# Patient Record
Sex: Male | Born: 1935 | Race: Black or African American | Hispanic: No | Marital: Married | Smoking: Never smoker
Health system: Southern US, Community
[De-identification: ages and names within clinical notes are randomized; demographics above are authoritative.]

## PROBLEM LIST (undated history)

## (undated) DIAGNOSIS — M2540 Effusion, unspecified joint: Secondary | ICD-10-CM

## (undated) DIAGNOSIS — M1990 Unspecified osteoarthritis, unspecified site: Secondary | ICD-10-CM

## (undated) DIAGNOSIS — C61 Malignant neoplasm of prostate: Secondary | ICD-10-CM

## (undated) DIAGNOSIS — M2550 Pain in unspecified joint: Secondary | ICD-10-CM

## (undated) HISTORY — PX: OTHER SURGICAL HISTORY: SHX169

---

## 2013-11-12 ENCOUNTER — Emergency Department (HOSPITAL_COMMUNITY)
Admission: EM | Admit: 2013-11-12 | Discharge: 2013-11-12 | Disposition: A | Discharge status: Home / Self Care | Payer: Medicare HMO | Attending: Emergency Medicine | Admitting: Emergency Medicine

## 2013-11-12 ENCOUNTER — Encounter (HOSPITAL_COMMUNITY): Payer: Self-pay | Admitting: Emergency Medicine

## 2013-11-12 ENCOUNTER — Emergency Department (HOSPITAL_COMMUNITY): Payer: Medicare HMO

## 2013-11-12 DIAGNOSIS — Z791 Long term (current) use of non-steroidal anti-inflammatories (NSAID): Secondary | ICD-10-CM | POA: Insufficient documentation

## 2013-11-12 DIAGNOSIS — R404 Transient alteration of awareness: Secondary | ICD-10-CM

## 2013-11-12 DIAGNOSIS — Z79899 Other long term (current) drug therapy: Secondary | ICD-10-CM | POA: Insufficient documentation

## 2013-11-12 DIAGNOSIS — F29 Unspecified psychosis not due to a substance or known physiological condition: Secondary | ICD-10-CM | POA: Insufficient documentation

## 2013-11-12 DIAGNOSIS — Z880 Allergy status to penicillin: Secondary | ICD-10-CM | POA: Insufficient documentation

## 2013-11-12 DIAGNOSIS — Z8546 Personal history of malignant neoplasm of prostate: Secondary | ICD-10-CM | POA: Insufficient documentation

## 2013-11-12 DIAGNOSIS — I10 Essential (primary) hypertension: Secondary | ICD-10-CM | POA: Insufficient documentation

## 2013-11-12 HISTORY — DX: Malignant neoplasm of prostate: C61

## 2013-11-12 LAB — CBC WITH DIFFERENTIAL/PLATELET
Basophils Absolute: 0 10*3/uL (ref 0.0–0.1)
Basophils Relative: 0 % (ref 0–1)
Eosinophils Absolute: 0 10*3/uL (ref 0.0–0.7)
Eosinophils Relative: 0 % (ref 0–5)
HEMATOCRIT: 43.8 % (ref 39.0–52.0)
Hemoglobin: 15.4 g/dL (ref 13.0–17.0)
LYMPHS ABS: 0.9 10*3/uL (ref 0.7–4.0)
LYMPHS PCT: 7 % — AB (ref 12–46)
MCH: 34.5 pg — ABNORMAL HIGH (ref 26.0–34.0)
MCHC: 35.2 g/dL (ref 30.0–36.0)
MCV: 98.2 fL (ref 78.0–100.0)
MONOS PCT: 5 % (ref 3–12)
Monocytes Absolute: 0.6 10*3/uL (ref 0.1–1.0)
NEUTROS ABS: 10.9 10*3/uL — AB (ref 1.7–7.7)
NEUTROS PCT: 88 % — AB (ref 43–77)
Platelets: 183 10*3/uL (ref 150–400)
RBC: 4.46 MIL/uL (ref 4.22–5.81)
RDW: 13.2 % (ref 11.5–15.5)
WBC: 12.3 10*3/uL — AB (ref 4.0–10.5)

## 2013-11-12 LAB — URINALYSIS, ROUTINE W REFLEX MICROSCOPIC
Bilirubin Urine: NEGATIVE
GLUCOSE, UA: NEGATIVE mg/dL
Ketones, ur: NEGATIVE mg/dL
LEUKOCYTES UA: NEGATIVE
Nitrite: NEGATIVE
Protein, ur: NEGATIVE mg/dL
SPECIFIC GRAVITY, URINE: 1.022 (ref 1.005–1.030)
Urobilinogen, UA: 0.2 mg/dL (ref 0.0–1.0)
pH: 6.5 (ref 5.0–8.0)

## 2013-11-12 LAB — URINE MICROSCOPIC-ADD ON

## 2013-11-12 LAB — GLUCOSE, CAPILLARY: Glucose-Capillary: 104 mg/dL — ABNORMAL HIGH (ref 70–99)

## 2013-11-12 LAB — POCT I-STAT TROPONIN I: Troponin i, poc: 0.01 ng/mL (ref 0.00–0.08)

## 2013-11-12 MED ORDER — ACETAMINOPHEN 325 MG PO TABS
650.0000 mg | ORAL_TABLET | Freq: Once | ORAL | Status: AC
  Administered 2013-11-12: 650 mg via ORAL
  Filled 2013-11-12: qty 2

## 2013-11-12 NOTE — Discharge Instructions (Signed)
Altered Mental Status Altered mental status most often refers to an abnormal change in your responsiveness and awareness. It can affect your speech, thought, mobility, memory, attention span, or alertness. It can range from slight confusion to complete unresponsiveness (coma). Altered mental status can be a sign of a serious underlying medical condition. Rapid evaluation and medical treatment is necessary for patients having an altered mental status. CAUSES   Low blood sugar (hypoglycemia) or diabetes.  Severe loss of body fluids (dehydration) or a body salt (electrolyte) imbalance.  A stroke or other neurologic problem, such as dementia or delirium.  A head injury or tumor.  A drug or alcohol overdose.  Exposure to toxins or poisons.  Depression, anxiety, and stress.  A low oxygen level (hypoxia).  An infection.  Blood loss.  Twitching or shaking (seizure).  Heart problems, such as heart attack or heart rhythm problems (arrhythmias).  A body temperature that is too low or too high (hypothermia or hyperthermia). DIAGNOSIS  A diagnosis is based on your history, symptoms, physical and neurologic examinations, and diagnostic tests. Diagnostic tests may include:  Measurement of your blood pressure, pulse, breathing, and oxygen levels (vital signs).  Blood tests.  Urine tests.  X-ray exams.  A computerized magnetic scan (magnetic resonance imaging, MRI).  A computerized X-ray scan (computed tomography, CT scan). TREATMENT  Treatment will depend on the cause. Treatment may include:  Management of an underlying medical or mental health condition.  Critical care or support in the hospital. [place]   Only take over-the-counter or prescription medicines for pain, discomfort, or fever as directed by your caregiver.  Manage underlying conditions as directed by your caregiver.  Eat a healthy, well-balanced diet to maintain strength.  Join a support group or  prevention program to cope with the condition or trauma that caused the altered mental status. Ask your caregiver to help choose a program that works for you.  Follow up with your caregiver for further examination, therapy, or testing as directed. SEEK MEDICAL CARE IF:   You feel unwell or have chills.  You or your family notice a change in your behavior or your alertness.  You have trouble following your caregiver's treatment plan.  You have questions or concerns. SEEK IMMEDIATE MEDICAL CARE IF:   You have a rapid heartbeat or have chest pain.  You have difficulty breathing.  You have a fever.  You have a headache with a stiff neck.  You cough up blood.  You have blood in your urine or stool.  You have severe agitation or confusion. MAKE SURE YOU:   Understand these instructions.  Will watch your condition.  Will get help right away if you are not doing well or get worse. Document Released: 03/06/2010 Document Revised: 12/09/2011 Document Reviewed: 03/06/2010 [facility] Patient Information 2014 [place].  Delirium Delirium (acute confusional state) is a sudden change in a person's brain function that causes the person to become confused for a short period of time. People with delirium often have trouble knowing where they are. Delirium comes on very fast. It can develop in a few days or just a few hours. Delirium usually occurs because of another mental or physical condition. For example, a person might develop delirium after a surgery. It is especially common in elderly people who are sick or in the hospital. People with dementia (a brain disease like Alzheimer's) or people who are near death may also develop delirium.  CAUSES  Delirium occurs when something affects the  signals that the brain sends out. These signals can be affected by anything that puts stress on the body and brain, causing brain chemicals to be out of balance. Structural health problems such as acute  strokes, bleeding near the brain (intracranial bleeds), and trauma can also cause delirium. Sometimes the exact cause of delirium is not known. Usually, several factors contribute to the development of delirium. Things that may cause a person to develop delirium include:  Surgery, especially when anesthetics are involved.  Chronic medical conditions, such as chronic lung, heart, or kidney disease.  Fever.  Low body temperature (hypothermia).  Infection, such as pneumonia, severe intestinal infection, severe skin infection, or urinary tract infection.  Poor nutrition that leads to very low vitamin or protein levels in the body (malnutrition).  Body fluid loss (dehydration).  Low blood sugar.  High blood sugar, which typically occurs in people with severe diabetes.  Electrolyte abnormalities, such as sodium imbalance or acid-base disorders.  Low oxygen level.  Low blood pressure.  Uncontrolled high blood pressure.  Brain injury (trauma).  Stroke.  Abuse of alcohol or sudden withdrawal of alcohol.  Sudden tobacco withdrawal if the person is a longtime smoker.  Loss of vision or hearing.  Being strapped down (restrained).  Being in a new setting, such as an elderly person being admitted to a hospital.  Taking illegal drugs or quitting use of those drugs.  Taking certain medicines for pain, sleep, allergies, high blood pressure, anxiety, depression, Parkinson's disease, or seizures.  Sundowning syndrome, which is a complication of chronic dementia that can occur in the later part of a person's wake cycle. SYMPTOMS  The main sign of delirium is a sudden change in a person's mental state. This change can come and go. Symptoms may include:  Not being able to pay attention.  Being confused about places, time, and people.  Seeing, hearing, or feeling things that are not real (hallucinations).  Changes in sleep patterns.  Being restless, hyperactive, irritable, and  angry.  Extreme mood swings.  Rambling and senseless talking.  Difficulty speaking or understanding speech.  Memory loss.  Changes in consciousness, such as being sleepy, sluggish, lethargic, and withdrawn.  Focusing on things or ideas that are not important.  Unusual body movements or shaking (tremors). DIAGNOSIS  There are no specific tests for delirium, but a caregiver may do the following:  Perform a mental status assessment.The caregiver will check for confusion and lack of awareness by talking with the person and asking questions.  Talk with the person's friends and family. A friend or family member will often need to tell the caregiver about the person's symptoms and medical history, including medicines taken or missed.  Perform a physical exam. The caregiver will perform a physical exam to check for underlying conditions that may lead to delirium, such as dehydration, malnutrition, trauma, and infection. The exam may include checking for changes in vision, hearing, and the way the person moves (coordination and reflexes). The caregiver may order tests, such as:  Blood tests.  Urine tests.  Brain imaging, such as a CT scan or MRI scan.  X-rays (to look for lung problems or intestinal blockage). TREATMENT  Treatment will focus on the cause of the delirium. Delirium is a sign of another problem. If that problem can be found and treated, the delirium may go away. Full recovery can take several weeks. Keeping the person safe until the cause can be found and treated (supportive care) is often what is needed.  In some cases, medicine may be prescribed to help keep the person calm. People with delirium should not be left alone because they may involuntarily harm themselves. HOME CARE INSTRUCTIONS  Supportive care is provided by the person living with, or caring for, the person with delirium. It involves keeping the person safe, encouraging healthy habits, and helping the person stay  aware of his or her surroundings. Take these steps to help care for someone with delirium:  Make sure the person eats a healthy diet.  Make sure the person gets enough fluids. The person should drink enough fluids to keep urine clear or pale yellow.  Do not leave the person alone.  Keep the person on a regular schedule. Maintain regular times for meals, sleeping, and being active.  Keep the home as quiet and stress-free as possible. This is very important at night and will help improve the quality of the person's sleep.  Let lots of sunlight into the home during the day.  Avoid total darkness at night.  Help the person maintain good hygiene to avoid skin infections, bed sores, and pressure ulcers.  Do activities outside as often as possible.  Take the person to see others whenever you can.  Make sure the person uses hearing aids and eyeglasses if needed.  Give frequent verbal reminders of the current time, location, and situation.  Use memory cues, such as clocks, calendars, and family photos.  Try to keep the person calm. Music or relaxation techniques may be helpful.  Always be on the lookout for symptoms of delirium.  Do not use restraints.  Only give over-the-counter or prescription medicines as directed by the caregiver.  Make sure the person takes all medicines on a regular schedule as directed by the caregiver.  Keep all follow-up appointments. SEEK MEDICAL CARE IF:  Any of the person's symptoms become worse.  New signs of delirium develop.  Caring for the person at home does not seem safe.  The person stops eating, drinking, or communicating.  The person develops vomiting. SEEK IMMEDIATE MEDICAL CARE IF:   Symptoms of delirium do not go away after treatment.  The person develops chest pain or shortness of breath.  The person seems to want to harm someone or harm himself or herself. MAKE SURE YOU:   Understand these instructions.  Will watch the  condition.  Will get help right away if the person is not doing well or gets worse. Document Released: 06/10/2012 Document Reviewed: 06/10/2012 [facility] Patient Information 2014 [place], [place].

## 2013-11-12 NOTE — ED Provider Notes (Signed)
Medical screening examination/treatment/procedure(s) were conducted as a shared visit with non-physician practitioner(s) or resident  and myself.  I personally evaluated the patient during the encounter and agree with the findings and plan unless otherwise indicated.    I have personally reviewed any xrays and/ or EKG's with the provider and I agree with interpretation.   New altered mental status since 9 am today, mild confusion with location and clothing, sxs resolved, pt at baseline per family, no hx of similar. No stroke hx.  CT and blood work/ UA unremarkable.  Rechecked, comfortable, normal neuro.  Plan for MRI head and likely fup outpt pending results or observation for TIA/ stroke eval.  5+ strength in UE and LE with f/e at major joints.  Neck supple, no rashes, no meningismus.  Sensation to palpation intact in UE and LE. CNs 2-12 grossly intact.  EOMFI.  PERRL.   Finger nose and coordination intact bilateral.   Visual fields intact to finger testing.  AO4.  Altered mental transient  Mariea Clonts, MD 11/12/13 1729

## 2013-11-12 NOTE — ED Provider Notes (Addendum)
MRI without acute findings or infarct.  Family denies any change in meds other than a BP pill.  But did have cortisone shot in his knee yesterday.  This could also be cause of sx.   No mind altering meds, however new stress in life due to wife being ill and he is caring for all her needs which could be cause of sx today.  Given strict return precautions and d/ced with f/u with pcp on Monday or tues.  Blanchie Dessert, MD 11/12/13 6948  Blanchie Dessert, MD 11/12/13 [phone]

## 2013-11-12 NOTE — ED Notes (Signed)
Per EMS pt from home, pt LSN last night. Pt woke up this morning and per family "not acting right" pt put on two different shoes, peeing outside. Alert and oriented x4. Pt has steady gait, no arm drift & leg drift. Negative stroke screen.

## 2013-11-12 NOTE — ED Notes (Signed)
Patient transported to MRI 

## 2013-11-12 NOTE — ED Provider Notes (Signed)
CSN: [id number]     Arrival date & time 11/12/13  1239 History   First MD Initiated Contact with Patient 11/12/13 1242     Chief Complaint  Patient presents with  . Altered Mental Status     (Consider location/radiation/quality/duration/timing/severity/associated sxs/prior Treatment) HPI  This is a 80 year old male presents from home via EMS for acute altered mental status.  History is taken from EMS report as well as the patient's niece who works here in the hospital.  According to reports the patient woke up this morning and was confused.  He put 2 different shoes on his feet.  Patient also went outside to urinate.  He is generally very alert and oriented.  He takes care of his wife at home.  His niece states this is unlike him.    Past Medical History  Diagnosis Date  . Cancer of prostate   . Hypertension    History reviewed. No pertinent past surgical history. No family history on file. History  Substance Use Topics  . Smoking status: Never Smoker   . Smokeless tobacco: Not on file  . Alcohol Use: No    Review of Systems  Unable to perform ROS     Allergies  Penicillins  Home Medications   Current Outpatient Rx  Name  Route  Sig  Dispense  Refill  . bethanechol (URECHOLINE) 50 MG tablet   Oral   Take 50 mg by mouth 2 (two) times daily with a meal.         . Cyanocobalamin (VITAMIN B12 PO)   Oral   Take 1 tablet by mouth every other day.         . hydrochlorothiazide (HYDRODIURIL) 12.5 MG tablet   Oral   Take 12.5 mg by mouth daily.         . meloxicam (MOBIC) 15 MG tablet   Oral   Take 15 mg by mouth daily with supper.          . tamsulosin (FLOMAX) 0.4 MG CAPS capsule   Oral   Take 0.4 mg by mouth 2 (two) times daily with a meal.           BP 168/69  Pulse 86  Temp(Src) 99.5 F (37.5 C) (Rectal)  Resp 15  SpO2 100% Physical Exam  Nursing note and vitals reviewed. Constitutional: He appears well-developed and well-nourished. No  distress.  HENT:  Head: Normocephalic and atraumatic.  Eyes: Conjunctivae are normal. No scleral icterus.  Neck: Normal range of motion. Neck supple.  Cardiovascular: Normal rate, regular rhythm and normal heart sounds.   Pulmonary/Chest: Effort normal and breath sounds normal. No respiratory distress.  Abdominal: Soft. Bowel sounds are normal. There is no tenderness.  Musculoskeletal: He exhibits no edema.  Neurological: He is alert.  Alert and oriented to person, place, and time. Speech is clear and goal oriented, follows commands Major Cranial nerves without deficit, no facial droop Normal strength in upper and lower extremities bilaterally including dorsiflexion and plantar flexion, strong and equal grip strength Sensation normal to light and sharp touch Moves extremities without ataxia, coordination intact Normal finger to nose and rapid alternating movements Neg romberg, no pronator drift Normal gait    Skin: Skin is warm and dry. He is not diaphoretic.  Psychiatric: His behavior is normal.    ED Course  Procedures (including critical care time) Labs Review Labs Reviewed  CBC WITH DIFFERENTIAL - Abnormal; Notable for the following:    WBC 12.3 (*)  MCH 34.5 (*)    Neutrophils Relative % 88 (*)    Neutro Abs 10.9 (*)    Lymphocytes Relative 7 (*)    All other components within normal limits  GLUCOSE, CAPILLARY - Abnormal; Notable for the following:    Glucose-Capillary 104 (*)    All other components within normal limits  URINALYSIS, ROUTINE W REFLEX MICROSCOPIC - Abnormal; Notable for the following:    Hgb urine dipstick SMALL (*)    All other components within normal limits  URINE MICROSCOPIC-ADD ON   Imaging Review Dg Chest 2 View  11/12/2013   CLINICAL DATA:  Altered mental status.  EXAM: CHEST  2 VIEW  COMPARISON:  None.  FINDINGS: The cardiac silhouette, mediastinal and hilar contours are normal. The lungs are clear. No pleural effusion. The bony thorax is  intact.  IMPRESSION: No acute cardiopulmonary findings.   Electronically Signed   By: Kalman Jewels M.D.   On: 11/12/2013 13:36   Ct Head Wo Contrast  11/12/2013   CLINICAL DATA:  Altered mental status  EXAM: CT HEAD WITHOUT CONTRAST  TECHNIQUE: Contiguous axial images were obtained from the base of the skull through the vertex without intravenous contrast. Study was obtained within 24 hr of patient's arrival at the emergency department.  COMPARISON:  December 11, 2005  FINDINGS: Moderate diffuse atrophy is stable. There is no mass, hemorrhage, extra-axial fluid collection, or midline shift. There is patchy small vessel disease throughout the centra semiovale bilaterally, stable. No new gray-white compartment lesion is identified. No acute infarct is demonstrable on this study.  Bony calvarium appears intact. Visualized mastoid air cells are clear.  IMPRESSION: Atrophy with supratentorial small vessel disease, stable. No intracranial mass, hemorrhage, or acute appearing infarct.   Electronically Signed   By: Lowella Grip M.D.   On: 11/12/2013 13:18    EKG Interpretation    Date/Time:  Friday November 12 2013 12:55:57 EST Ventricular Rate:  82 PR Interval:  122 QRS Duration: 86 QT Interval:  356 QTC Calculation: 416 R Axis:   80 Text Interpretation:  Sinus rhythm Mild ST elevation, no recipricol changes Confirmed by ZAVITZ  MD, JOSHUA ([id number]) on 11/12/2013 2:49:01 PM            MDM   Final diagnoses:  None    Patient seen and cared visit with Dr. Reather Converse Patient here with what appears to be a delerium. The patient is alert and oriented here.  According to his niece he appears to be at baseline currently.  Patient does have slight ST elevation without reciprocal change and there is no old EKG to compare it with.  CT scan is without acute abnormalityi area at a sign of a UTI on urinalysis.  Patient does have a leukocytosis with a left shift.  Normal chest x-ray.  Troponin is currently  pending.  Patient will be sent for MRI of the brain to rule out stroke.  4:33 PM Troponin negative Patient awaiting MRI head. I have given report To Dr. Maryan Rued. Patient will be discharged home for OP TIA work up if negative.  Margarita Mail, PA-C 11/12/13 [phone]

## 2014-10-11 DIAGNOSIS — M1711 Unilateral primary osteoarthritis, right knee: Secondary | ICD-10-CM | POA: Diagnosis not present

## 2014-10-11 DIAGNOSIS — M1712 Unilateral primary osteoarthritis, left knee: Secondary | ICD-10-CM | POA: Diagnosis not present

## 2014-11-02 DIAGNOSIS — R339 Retention of urine, unspecified: Secondary | ICD-10-CM | POA: Diagnosis not present

## 2014-11-02 DIAGNOSIS — C61 Malignant neoplasm of prostate: Secondary | ICD-10-CM | POA: Diagnosis not present

## 2014-11-03 ENCOUNTER — Other Ambulatory Visit: Payer: Self-pay

## 2014-11-03 ENCOUNTER — Encounter (HOSPITAL_COMMUNITY): Payer: Self-pay

## 2014-11-03 ENCOUNTER — Encounter (HOSPITAL_COMMUNITY)
Admission: RE | Admit: 2014-11-03 | Discharge: 2014-11-03 | Disposition: A | Discharge status: Home / Self Care | Payer: Medicare HMO | Source: Ambulatory Visit | Attending: Orthopedic Surgery | Admitting: Orthopedic Surgery

## 2014-11-03 DIAGNOSIS — Z0183 Encounter for blood typing: Secondary | ICD-10-CM | POA: Insufficient documentation

## 2014-11-03 DIAGNOSIS — M179 Osteoarthritis of knee, unspecified: Secondary | ICD-10-CM | POA: Diagnosis not present

## 2014-11-03 DIAGNOSIS — Z01812 Encounter for preprocedural laboratory examination: Secondary | ICD-10-CM | POA: Diagnosis not present

## 2014-11-03 DIAGNOSIS — Z01810 Encounter for preprocedural cardiovascular examination: Secondary | ICD-10-CM | POA: Insufficient documentation

## 2014-11-03 HISTORY — DX: Effusion, unspecified joint: M25.40

## 2014-11-03 HISTORY — DX: Unspecified osteoarthritis, unspecified site: M19.90

## 2014-11-03 HISTORY — DX: Pain in unspecified joint: M25.50

## 2014-11-03 LAB — CBC
HCT: 44.9 % (ref 39.0–52.0)
HEMOGLOBIN: 14.9 g/dL (ref 13.0–17.0)
MCH: 32.6 pg (ref 26.0–34.0)
MCHC: 33.2 g/dL (ref 30.0–36.0)
MCV: 98.2 fL (ref 78.0–100.0)
Platelets: 155 10*3/uL (ref 150–400)
RBC: 4.57 MIL/uL (ref 4.22–5.81)
RDW: 13.5 % (ref 11.5–15.5)
WBC: 4.5 10*3/uL (ref 4.0–10.5)

## 2014-11-03 LAB — SURGICAL PCR SCREEN
MRSA, PCR: NEGATIVE
Staphylococcus aureus: NEGATIVE

## 2014-11-03 LAB — ABO/RH: ABO/RH(D): O POS

## 2014-11-03 LAB — URINALYSIS, ROUTINE W REFLEX MICROSCOPIC
BILIRUBIN URINE: NEGATIVE
GLUCOSE, UA: NEGATIVE mg/dL
KETONES UR: NEGATIVE mg/dL
LEUKOCYTES UA: NEGATIVE
NITRITE: NEGATIVE
PH: 6 (ref 5.0–8.0)
Protein, ur: NEGATIVE mg/dL
Specific Gravity, Urine: 1.021 (ref 1.005–1.030)
Urobilinogen, UA: 0.2 mg/dL (ref 0.0–1.0)

## 2014-11-03 LAB — URINE MICROSCOPIC-ADD ON

## 2014-11-03 LAB — BASIC METABOLIC PANEL
Anion gap: 5 (ref 5–15)
BUN: 14 mg/dL (ref 6–23)
CO2: 31 mmol/L (ref 19–32)
Calcium: 9.4 mg/dL (ref 8.4–10.5)
Chloride: 105 mmol/L (ref 96–112)
Creatinine, Ser: 1.19 mg/dL (ref 0.50–1.35)
GFR calc Af Amer: 59 mL/min — ABNORMAL LOW (ref >90)
GFR calc non Af Amer: 51 mL/min — ABNORMAL LOW (ref >90)
Glucose, Bld: 81 mg/dL (ref 70–99)
Potassium: 4 mmol/L (ref 3.5–5.1)
Sodium: 141 mmol/L (ref 135–145)

## 2014-11-03 LAB — APTT: APTT: 32 s (ref 24–37)

## 2014-11-03 LAB — PROTIME-INR
INR: 1.07 (ref 0.00–1.49)
Prothrombin Time: 14.1 seconds (ref 11.6–15.2)

## 2014-11-03 LAB — TYPE AND SCREEN
ABO/RH(D): O POS
Antibody Screen: NEGATIVE

## 2014-11-03 NOTE — Progress Notes (Addendum)
Medical Md is with [facility] in [place] doesn't have a cardiologist  Denies ever having a heart cath/stress test/echo  EKG done last 11/12/13

## 2014-11-03 NOTE — Pre-Procedure Instructions (Signed)
Alan Rios  11/03/2014   Your procedure is scheduled on:  Mon, Feb 15 @ 7:30 AM  Report to [facility]  at 5:30 AM.  Call this number if you have problems the morning of surgery: [phone]   Remember:   Do not eat food or drink liquids after midnight.   Take these medicines the morning of surgery with A SIP OF WATER: Flomax(Tamsulosin)              Stop taking your Meloxicam and Ibuprofen. No Goody's,BC's,Aleve,Aspirin,Fish Oil,or any Herbal Medications.   Do not wear jewelry.  Do not wear lotions, powders, or colognes. You may wear deodorant.  Men may shave face and neck.  Do not bring valuables to the hospital.  [facility] is not responsible                  for any belongings or valuables.               Contacts, dentures or bridgework may not be worn into surgery.  Leave suitcase in the car. After surgery it may be brought to your room.  For patients admitted to the hospital, discharge time is determined by your                treatment team.                 Special Instructions:  [place] - Preparing for Surgery  Before surgery, you can play an important role.  Because skin is not sterile, your skin needs to be as free of germs as possible.  You can reduce the number of germs on you skin by washing with CHG (chlorahexidine gluconate) soap before surgery.  CHG is an antiseptic cleaner which kills germs and bonds with the skin to continue killing germs even after washing.  Please DO NOT use if you have an allergy to CHG or antibacterial soaps.  If your skin becomes reddened/irritated stop using the CHG and inform your nurse when you arrive at Short Stay.  Do not shave (including legs and underarms) for at least 48 hours prior to the first CHG shower.  You may shave your face.  Please follow these instructions carefully:   1.  Shower with CHG Soap the night before surgery and the                                morning of Surgery.  2.  If you choose to wash  your hair, wash your hair first as usual with your       normal shampoo.  3.  After you shampoo, rinse your hair and body thoroughly to remove the                      Shampoo.  4.  Use CHG as you would any other liquid soap.  You can apply chg directly       to the skin and wash gently with scrungie or a clean washcloth.  5.  Apply the CHG Soap to your body ONLY FROM THE NECK DOWN.        Do not use on open wounds or open sores.  Avoid contact with your eyes,       ears, mouth and genitals (private parts).  Wash genitals (private parts)       with your normal soap.  6.  Wash  thoroughly, paying special attention to the area where your surgery        will be performed.  7.  Thoroughly rinse your body with warm water from the neck down.  8.  DO NOT shower/wash with your normal soap after using and rinsing off       the CHG Soap.  9.  Pat yourself dry with a clean towel.            10.  Wear clean pajamas.            11.  Place clean sheets on your bed the night of your first shower and do not        sleep with pets.  Day of Surgery  Do not apply any lotions/deoderants the morning of surgery.  Please wear clean clothes to the hospital/surgery center.     Please read over the following fact sheets that you were given: Pain Booklet, Coughing and Deep Breathing, Blood Transfusion Information, MRSA Information and Surgical Site Infection Prevention

## 2014-11-10 NOTE — H&P (Signed)
TOTAL KNEE ADMISSION H&P  Patient is being admitted for left total knee arthroplasty.  Subjective:  Chief Complaint:left knee pain.  HPI: Alan Rios, 81 y.o. male, has a history of pain and functional disability in the left knee due to arthritis and has failed non-surgical conservative treatments for greater than 12 weeks to includeNSAID's and/or analgesics, corticosteriod injections, viscosupplementation injections and use of assistive devices.  Onset of symptoms was gradual, starting >10 years ago with gradually worsening course since that time. The patient noted no past surgery on the left knee(s).  Patient currently rates pain in the left knee(s) at 10 out of 10 with activity. Patient has night pain, worsening of pain with activity and weight bearing, pain that interferes with activities of daily living and crepitus.  Patient has evidence of subchondral sclerosis, periarticular osteophytes and joint space narrowing by imaging studies. There is no active infection.  There are no active problems to display for this patient.  Past Medical History  Diagnosis Date  . Cancer of prostate   . Arthritis   . Joint swelling   . Joint pain     Past Surgical History  Procedure Laterality Date  . Cataract surgery Bilateral     No prescriptions prior to admission   Allergies  Allergen Reactions  . Penicillins Hives    History  Substance Use Topics  . Smoking status: Never Smoker   . Smokeless tobacco: Not on file  . Alcohol Use: No    No family history on file.   Review of Systems  Constitutional: Negative.   HENT: Positive for hearing loss.   Eyes: Negative.   Respiratory: Negative.   Cardiovascular: Negative.   Gastrointestinal: Negative.   Genitourinary: Negative.   Musculoskeletal: Positive for joint pain.  Skin: Negative.   Neurological: Negative.   Endo/Heme/Allergies: Negative.   Psychiatric/Behavioral: Negative.     Objective:  Physical Exam  Constitutional:  He is oriented to person, place, and time. He appears well-developed and well-nourished.  HENT:  Head: Normocephalic and atraumatic.  Eyes: Pupils are equal, round, and reactive to light.  Neck: Normal range of motion. Neck supple.  Cardiovascular: Intact distal pulses.   Respiratory: Effort normal.  Musculoskeletal: He exhibits tenderness.  He has impressive varus deformities of both knees with 1-2+ effusions bilaterally, 10 flexion contractures and can flex  To 120 bilaterally.    Neurological: He is alert and oriented to person, place, and time.  Skin: Skin is warm and dry.  Psychiatric: He has a normal mood and affect. His behavior is normal. Judgment and thought content normal.    Vital signs in last 24 hours:    Labs:   There is no height or weight on file to calculate BMI.   Imaging Review Plain radiographs demonstrate bilateral AP weight-bearing and bilateral Lutricia Feil are taken and reviewed in office today.  Patient does have medial compartment collapse of bilateral knees with near end-stage arthritis bilaterally.  Assessment/Plan:  End stage arthritis, left knee   The patient history, physical examination, clinical judgment of the provider and imaging studies are consistent with end stage degenerative joint disease of the left knee(s) and total knee arthroplasty is deemed medically necessary. The treatment options including medical management, injection therapy arthroscopy and arthroplasty were discussed at length. The risks and benefits of total knee arthroplasty were presented and reviewed. The risks due to aseptic loosening, infection, stiffness, patella tracking problems, thromboembolic complications and other imponderables were discussed. The patient acknowledged the explanation, agreed to  proceed with the plan and consent was signed. Patient is being admitted for inpatient treatment for surgery, pain control, PT, OT, prophylactic antibiotics, VTE prophylaxis,  progressive ambulation and ADL's and discharge planning. The patient is planning to be discharged to skilled nursing facility

## 2014-11-14 ENCOUNTER — Inpatient Hospital Stay (HOSPITAL_COMMUNITY)
Admission: RE | Admit: 2014-11-14 | Discharge: 2014-11-16 | DRG: 470 | Disposition: A | Discharge status: Skilled Nursing Facility | Payer: Commercial Managed Care - HMO | Source: Ambulatory Visit | Attending: Orthopedic Surgery | Admitting: Orthopedic Surgery

## 2014-11-14 ENCOUNTER — Inpatient Hospital Stay (HOSPITAL_COMMUNITY): Payer: Commercial Managed Care - HMO | Admitting: Anesthesiology

## 2014-11-14 ENCOUNTER — Encounter (HOSPITAL_COMMUNITY)
Admission: RE | Disposition: A | Discharge status: Skilled Nursing Facility | Payer: Self-pay | Source: Ambulatory Visit | Attending: Orthopedic Surgery

## 2014-11-14 ENCOUNTER — Encounter (HOSPITAL_COMMUNITY): Payer: Self-pay | Admitting: Anesthesiology

## 2014-11-14 DIAGNOSIS — Z471 Aftercare following joint replacement surgery: Secondary | ICD-10-CM | POA: Diagnosis not present

## 2014-11-14 DIAGNOSIS — N400 Enlarged prostate without lower urinary tract symptoms: Secondary | ICD-10-CM | POA: Diagnosis not present

## 2014-11-14 DIAGNOSIS — M1712 Unilateral primary osteoarthritis, left knee: Secondary | ICD-10-CM | POA: Diagnosis not present

## 2014-11-14 DIAGNOSIS — S83105A Unspecified dislocation of left knee, initial encounter: Secondary | ICD-10-CM | POA: Diagnosis not present

## 2014-11-14 DIAGNOSIS — Z880 Allergy status to penicillin: Secondary | ICD-10-CM

## 2014-11-14 DIAGNOSIS — D62 Acute posthemorrhagic anemia: Secondary | ICD-10-CM | POA: Diagnosis not present

## 2014-11-14 DIAGNOSIS — Z96652 Presence of left artificial knee joint: Secondary | ICD-10-CM | POA: Diagnosis not present

## 2014-11-14 DIAGNOSIS — M6281 Muscle weakness (generalized): Secondary | ICD-10-CM | POA: Diagnosis not present

## 2014-11-14 DIAGNOSIS — I10 Essential (primary) hypertension: Secondary | ICD-10-CM | POA: Diagnosis not present

## 2014-11-14 DIAGNOSIS — Z8546 Personal history of malignant neoplasm of prostate: Secondary | ICD-10-CM | POA: Diagnosis not present

## 2014-11-14 DIAGNOSIS — R2681 Unsteadiness on feet: Secondary | ICD-10-CM | POA: Diagnosis not present

## 2014-11-14 DIAGNOSIS — R1311 Dysphagia, oral phase: Secondary | ICD-10-CM | POA: Diagnosis not present

## 2014-11-14 DIAGNOSIS — R278 Other lack of coordination: Secondary | ICD-10-CM | POA: Diagnosis not present

## 2014-11-14 DIAGNOSIS — M25562 Pain in left knee: Secondary | ICD-10-CM | POA: Diagnosis not present

## 2014-11-14 DIAGNOSIS — M179 Osteoarthritis of knee, unspecified: Secondary | ICD-10-CM | POA: Diagnosis not present

## 2014-11-14 DIAGNOSIS — R41841 Cognitive communication deficit: Secondary | ICD-10-CM | POA: Diagnosis not present

## 2014-11-14 HISTORY — PX: TOTAL KNEE ARTHROPLASTY: SHX125

## 2014-11-14 SURGERY — ARTHROPLASTY, KNEE, TOTAL
Anesthesia: Spinal | Site: Knee | Laterality: Left

## 2014-11-14 MED ORDER — BUPIVACAINE LIPOSOME 1.3 % IJ SUSP
20.0000 mL | Freq: Once | INTRAMUSCULAR | Status: DC
Start: 2014-11-14 — End: 2014-11-14
  Filled 2014-11-14: qty 20

## 2014-11-14 MED ORDER — METOCLOPRAMIDE HCL 10 MG PO TABS: 5.0000 mg | ORAL_TABLET | Freq: Three times a day (TID) | ORAL | Status: DC | PRN

## 2014-11-14 MED ORDER — FENTANYL CITRATE 0.05 MG/ML IJ SOLN
INTRAMUSCULAR | Status: DC | PRN
  Administered 2014-11-14 (×2): 25 ug via INTRAVENOUS

## 2014-11-14 MED ORDER — FLEET ENEMA 7-19 GM/118ML RE ENEM: 1.0000 | ENEMA | Freq: Once | RECTAL | Status: AC | PRN

## 2014-11-14 MED ORDER — MENTHOL 3 MG MT LOZG: 1.0000 | LOZENGE | OROMUCOSAL | Status: DC | PRN

## 2014-11-14 MED ORDER — FENTANYL CITRATE 0.05 MG/ML IJ SOLN
25.0000 ug | INTRAMUSCULAR | Status: DC | PRN
Start: 2014-11-14 — End: 2014-11-14

## 2014-11-14 MED ORDER — TRANEXAMIC ACID 100 MG/ML IV SOLN
2000.0000 mg | Freq: Once | INTRAVENOUS | Status: AC
  Administered 2014-11-14: 2000 mg via TOPICAL
  Filled 2014-11-14: qty 20

## 2014-11-14 MED ORDER — BISACODYL 5 MG PO TBEC: 5.0000 mg | DELAYED_RELEASE_TABLET | Freq: Every day | ORAL | Status: DC | PRN

## 2014-11-14 MED ORDER — HYDROCODONE-ACETAMINOPHEN 5-325 MG PO TABS: 1.0000 | ORAL_TABLET | Freq: Four times a day (QID) | ORAL | Status: AC | PRN

## 2014-11-14 MED ORDER — PROPOFOL INFUSION 10 MG/ML OPTIME
INTRAVENOUS | Status: DC | PRN
  Administered 2014-11-14: 25 ug/kg/min via INTRAVENOUS

## 2014-11-14 MED ORDER — ONDANSETRON HCL 4 MG PO TABS: 4.0000 mg | ORAL_TABLET | Freq: Four times a day (QID) | ORAL | Status: DC | PRN

## 2014-11-14 MED ORDER — BETHANECHOL CHLORIDE 25 MG PO TABS
50.0000 mg | ORAL_TABLET | Freq: Two times a day (BID) | ORAL | Status: DC
  Administered 2014-11-14 – 2014-11-16 (×4): 50 mg via ORAL
  Filled 2014-11-14 (×5): qty 2

## 2014-11-14 MED ORDER — ACETAMINOPHEN 325 MG PO TABS: 325.0000 mg | ORAL_TABLET | ORAL | Status: DC | PRN

## 2014-11-14 MED ORDER — PHENOL 1.4 % MT LIQD: 1.0000 | OROMUCOSAL | Status: DC | PRN

## 2014-11-14 MED ORDER — VANCOMYCIN HCL IN DEXTROSE 1-5 GM/200ML-% IV SOLN
1000.0000 mg | Freq: Once | INTRAVENOUS | Status: AC
  Administered 2014-11-14: 1000 mg via INTRAVENOUS
  Filled 2014-11-14 (×2): qty 200

## 2014-11-14 MED ORDER — CEFUROXIME SODIUM 1.5 G IJ SOLR
INTRAMUSCULAR | Status: AC
  Filled 2014-11-14: qty 1.5

## 2014-11-14 MED ORDER — DOCUSATE SODIUM 100 MG PO CAPS
100.0000 mg | ORAL_CAPSULE | Freq: Two times a day (BID) | ORAL | Status: DC
  Administered 2014-11-14 – 2014-11-16 (×5): 100 mg via ORAL
  Filled 2014-11-14 (×4): qty 1

## 2014-11-14 MED ORDER — ONDANSETRON HCL 4 MG/2ML IJ SOLN: 4.0000 mg | Freq: Four times a day (QID) | INTRAMUSCULAR | Status: DC | PRN

## 2014-11-14 MED ORDER — DEXTROSE 5 % IV SOLN
INTRAVENOUS | Status: DC | PRN
  Administered 2014-11-14: 07:00:00 via INTRAVENOUS

## 2014-11-14 MED ORDER — LACTATED RINGERS IV SOLN
INTRAVENOUS | Status: DC | PRN
  Administered 2014-11-14 (×2): via INTRAVENOUS

## 2014-11-14 MED ORDER — ASPIRIN EC 325 MG PO TBEC: 325.0000 mg | DELAYED_RELEASE_TABLET | Freq: Two times a day (BID) | ORAL | Status: DC

## 2014-11-14 MED ORDER — SENNOSIDES-DOCUSATE SODIUM 8.6-50 MG PO TABS: 1.0000 | ORAL_TABLET | Freq: Every evening | ORAL | Status: DC | PRN

## 2014-11-14 MED ORDER — METOCLOPRAMIDE HCL 5 MG/ML IJ SOLN: 5.0000 mg | Freq: Three times a day (TID) | INTRAMUSCULAR | Status: DC | PRN

## 2014-11-14 MED ORDER — SODIUM CHLORIDE 0.9 % IR SOLN
Status: DC | PRN
  Administered 2014-11-14: 1000 mL

## 2014-11-14 MED ORDER — PROPOFOL 10 MG/ML IV BOLUS
INTRAVENOUS | Status: AC
  Filled 2014-11-14: qty 20

## 2014-11-14 MED ORDER — BUPIVACAINE LIPOSOME 1.3 % IJ SUSP
INTRAMUSCULAR | Status: DC | PRN
  Administered 2014-11-14: 20 mL

## 2014-11-14 MED ORDER — DIPHENHYDRAMINE HCL 12.5 MG/5ML PO ELIX
12.5000 mg | ORAL_SOLUTION | ORAL | Status: DC | PRN
Start: 2014-11-14 — End: 2014-11-16

## 2014-11-14 MED ORDER — HYDROMORPHONE HCL 1 MG/ML IJ SOLN
1.0000 mg | INTRAMUSCULAR | Status: DC | PRN
  Administered 2014-11-14 – 2014-11-15 (×4): 1 mg via INTRAVENOUS
  Filled 2014-11-14 (×4): qty 1

## 2014-11-14 MED ORDER — METHOCARBAMOL 1000 MG/10ML IJ SOLN: 500.0000 mg | Freq: Four times a day (QID) | INTRAVENOUS | Status: DC | PRN

## 2014-11-14 MED ORDER — KCL IN DEXTROSE-NACL 20-5-0.45 MEQ/L-%-% IV SOLN
INTRAVENOUS | Status: DC
  Administered 2014-11-14 – 2014-11-15 (×3): via INTRAVENOUS
  Filled 2014-11-14 (×10): qty 1000

## 2014-11-14 MED ORDER — ASPIRIN EC 325 MG PO TBEC
325.0000 mg | DELAYED_RELEASE_TABLET | Freq: Every day | ORAL | Status: DC
  Administered 2014-11-15 – 2014-11-16 (×2): 325 mg via ORAL
  Filled 2014-11-14 (×2): qty 1

## 2014-11-14 MED ORDER — ALUM & MAG HYDROXIDE-SIMETH 200-200-20 MG/5ML PO SUSP
30.0000 mL | ORAL | Status: DC | PRN
Start: 2014-11-14 — End: 2014-11-16

## 2014-11-14 MED ORDER — ACETAMINOPHEN 650 MG RE SUPP: 650.0000 mg | Freq: Four times a day (QID) | RECTAL | Status: DC | PRN

## 2014-11-14 MED ORDER — TAMSULOSIN HCL 0.4 MG PO CAPS
0.4000 mg | ORAL_CAPSULE | Freq: Two times a day (BID) | ORAL | Status: DC
  Administered 2014-11-14 – 2014-11-16 (×4): 0.4 mg via ORAL
  Filled 2014-11-14 (×4): qty 1

## 2014-11-14 MED ORDER — FENTANYL CITRATE 0.05 MG/ML IJ SOLN
INTRAMUSCULAR | Status: AC
  Filled 2014-11-14: qty 5

## 2014-11-14 MED ORDER — BUPIVACAINE IN DEXTROSE 0.75-8.25 % IT SOLN
INTRATHECAL | Status: DC | PRN
  Administered 2014-11-14: 1.8 mL via INTRATHECAL

## 2014-11-14 MED ORDER — OXYCODONE HCL 5 MG PO TABS
5.0000 mg | ORAL_TABLET | ORAL | Status: DC | PRN
  Administered 2014-11-14 (×3): 10 mg via ORAL
  Filled 2014-11-14 (×4): qty 2

## 2014-11-14 MED ORDER — TIZANIDINE HCL 2 MG PO CAPS: 2.0000 mg | ORAL_CAPSULE | Freq: Three times a day (TID) | ORAL | Status: AC

## 2014-11-14 MED ORDER — METHOCARBAMOL 500 MG PO TABS
500.0000 mg | ORAL_TABLET | Freq: Four times a day (QID) | ORAL | Status: DC | PRN
  Administered 2014-11-14: 500 mg via ORAL
  Filled 2014-11-14: qty 1

## 2014-11-14 MED ORDER — HYDROCHLOROTHIAZIDE 25 MG PO TABS
12.5000 mg | ORAL_TABLET | Freq: Every day | ORAL | Status: DC
  Administered 2014-11-14 – 2014-11-15 (×2): 12.5 mg via ORAL
  Filled 2014-11-14 (×2): qty 1

## 2014-11-14 MED ORDER — SODIUM CHLORIDE 0.9 % IJ SOLN
INTRAMUSCULAR | Status: DC | PRN
  Administered 2014-11-14: 40 mL via INTRAVENOUS

## 2014-11-14 MED ORDER — ACETAMINOPHEN 325 MG PO TABS
650.0000 mg | ORAL_TABLET | Freq: Four times a day (QID) | ORAL | Status: DC | PRN
  Administered 2014-11-15 – 2014-11-16 (×4): 650 mg via ORAL
  Filled 2014-11-14 (×4): qty 2

## 2014-11-14 MED ORDER — ACETAMINOPHEN 160 MG/5ML PO SOLN: 325.0000 mg | ORAL | Status: DC | PRN

## 2014-11-14 SURGICAL SUPPLY — 67 items
BANDAGE ELASTIC 4 VELCRO ST LF (GAUZE/BANDAGES/DRESSINGS) ×2 IMPLANT
BANDAGE ELASTIC 6 VELCRO ST LF (GAUZE/BANDAGES/DRESSINGS) ×2 IMPLANT
BANDAGE ESMARK 6X9 LF (GAUZE/BANDAGES/DRESSINGS) ×1 IMPLANT
BLADE SAG 18X100X1.27 (BLADE) ×3 IMPLANT
BLADE SAW SGTL 13X75X1.27 (BLADE) ×3 IMPLANT
BLADE SURG ROTATE 9660 (MISCELLANEOUS) IMPLANT
BNDG CMPR 9X6 STRL LF SNTH (GAUZE/BANDAGES/DRESSINGS) ×1
BNDG CMPR MED 10X6 ELC LF (GAUZE/BANDAGES/DRESSINGS) ×1
BNDG ELASTIC 6X10 VLCR STRL LF (GAUZE/BANDAGES/DRESSINGS) ×3 IMPLANT
BNDG ESMARK 6X9 LF (GAUZE/BANDAGES/DRESSINGS) ×3
BOWL SMART MIX CTS (DISPOSABLE) ×3 IMPLANT
CAP KNEE TOTAL 3 SIGMA ×2 IMPLANT
CEMENT HV SMART SET (Cement) ×6 IMPLANT
COVER SURGICAL LIGHT HANDLE (MISCELLANEOUS) ×3 IMPLANT
CUFF TOURNIQUET SINGLE 34IN LL (TOURNIQUET CUFF) IMPLANT
CUFF TOURNIQUET SINGLE 44IN (TOURNIQUET CUFF) IMPLANT
DRAPE EXTREMITY T 121X128X90 (DRAPE) ×3 IMPLANT
DRAPE IMP U-DRAPE 54X76 (DRAPES) ×3 IMPLANT
DRAPE U-SHAPE 47X51 STRL (DRAPES) ×3 IMPLANT
DURAPREP 26ML APPLICATOR (WOUND CARE) ×6 IMPLANT
ELECT REM PT RETURN 9FT ADLT (ELECTROSURGICAL) ×3
ELECTRODE REM PT RTRN 9FT ADLT (ELECTROSURGICAL) ×1 IMPLANT
EVACUATOR 1/8 PVC DRAIN (DRAIN) ×3 IMPLANT
GAUZE SPONGE 4X4 12PLY STRL (GAUZE/BANDAGES/DRESSINGS) ×6 IMPLANT
GAUZE XEROFORM 1X8 LF (GAUZE/BANDAGES/DRESSINGS) ×3 IMPLANT
GLOVE BIO SURGEON STRL SZ 6 (GLOVE) ×2 IMPLANT
GLOVE BIO SURGEON STRL SZ7 (GLOVE) ×2 IMPLANT
GLOVE BIO SURGEON STRL SZ7.5 (GLOVE) ×3 IMPLANT
GLOVE BIO SURGEON STRL SZ8.5 (GLOVE) ×5 IMPLANT
GLOVE BIOGEL PI IND STRL 8 (GLOVE) ×1 IMPLANT
GLOVE BIOGEL PI IND STRL 9 (GLOVE) ×1 IMPLANT
GLOVE BIOGEL PI INDICATOR 8 (GLOVE) ×4
GLOVE BIOGEL PI INDICATOR 9 (GLOVE) ×2
GOWN STRL REUS W/ TWL LRG LVL3 (GOWN DISPOSABLE) ×1 IMPLANT
GOWN STRL REUS W/ TWL XL LVL3 (GOWN DISPOSABLE) ×2 IMPLANT
GOWN STRL REUS W/TWL LRG LVL3 (GOWN DISPOSABLE) ×3
GOWN STRL REUS W/TWL XL LVL3 (GOWN DISPOSABLE) ×6
HANDPIECE INTERPULSE COAX TIP (DISPOSABLE) ×3
HOOD PEEL AWAY FACE SHEILD DIS (HOOD) ×6 IMPLANT
KIT BASIN OR (CUSTOM PROCEDURE TRAY) ×3 IMPLANT
KIT ROOM TURNOVER OR (KITS) ×3 IMPLANT
MANIFOLD NEPTUNE II (INSTRUMENTS) ×3 IMPLANT
NDL SAFETY ECLIPSE 18X1.5 (NEEDLE) IMPLANT
NDL SPNL 18GX3.5 QUINCKE PK (NEEDLE) IMPLANT
NEEDLE 22X1 1/2 (OR ONLY) (NEEDLE) ×3 IMPLANT
NEEDLE HYPO 18GX1.5 SHARP (NEEDLE)
NEEDLE SPNL 18GX3.5 QUINCKE PK (NEEDLE) IMPLANT
NS IRRIG 1000ML POUR BTL (IV SOLUTION) ×3 IMPLANT
PACK TOTAL JOINT (CUSTOM PROCEDURE TRAY) ×3 IMPLANT
PACK UNIVERSAL I (CUSTOM PROCEDURE TRAY) ×3 IMPLANT
PAD ARMBOARD 7.5X6 YLW CONV (MISCELLANEOUS) ×6 IMPLANT
PADDING CAST COTTON 6X4 STRL (CAST SUPPLIES) ×3 IMPLANT
SET HNDPC FAN SPRY TIP SCT (DISPOSABLE) ×1 IMPLANT
SPONGE GAUZE 4X4 12PLY STER LF (GAUZE/BANDAGES/DRESSINGS) ×2 IMPLANT
STAPLER VISISTAT 35W (STAPLE) ×3 IMPLANT
SUCTION FRAZIER TIP 10 FR DISP (SUCTIONS) ×3 IMPLANT
SUT VIC AB 0 CT1 27 (SUTURE) ×3
SUT VIC AB 0 CT1 27XBRD ANBCTR (SUTURE) ×1 IMPLANT
SUT VIC AB 1 CTX 36 (SUTURE) ×3
SUT VIC AB 1 CTX36XBRD ANBCTR (SUTURE) ×1 IMPLANT
SUT VIC AB 2-0 CT1 27 (SUTURE) ×3
SUT VIC AB 2-0 CT1 TAPERPNT 27 (SUTURE) ×1 IMPLANT
SYR 30ML LL (SYRINGE) ×3 IMPLANT
SYR 50ML LL SCALE MARK (SYRINGE) ×3 IMPLANT
TOWEL OR 17X24 6PK STRL BLUE (TOWEL DISPOSABLE) ×3 IMPLANT
TOWEL OR 17X26 10 PK STRL BLUE (TOWEL DISPOSABLE) ×3 IMPLANT
WATER STERILE IRR 1000ML POUR (IV SOLUTION) ×6 IMPLANT

## 2014-11-14 NOTE — Interval H&P Note (Signed)
History and Physical Interval Note:  11/14/2014 7:08 AM  Alan Rios  has presented today for surgery, with the diagnosis of OSTEOARTHRITIS LEFT KNEE  The various methods of treatment have been discussed with the patient and family. After consideration of risks, benefits and other options for treatment, the patient has consented to  Procedure(s): TOTAL KNEE ARTHROPLASTY (Left) as a surgical intervention .  The patient's history has been reviewed, patient examined, no change in status, stable for surgery.  I have reviewed the patient's chart and labs.  Questions were answered to the patient's satisfaction.     Kerin Salen

## 2014-11-14 NOTE — Anesthesia Postprocedure Evaluation (Signed)
  Anesthesia Post-op Note  Patient: Alan Rios  Procedure(s) Performed: Procedure(s): TOTAL KNEE ARTHROPLASTY (Left)  Patient Location: PACU  Anesthesia Type:Spinal  Level of Consciousness: awake and alert   Airway and Oxygen Therapy: Patient Spontanous Breathing  Post-op Pain: none  Post-op Assessment: Post-op Vital signs reviewed, Patient's Cardiovascular Status Stable, Respiratory Function Stable, Patent Airway, No signs of Nausea or vomiting and Pain level controlled  Post-op Vital Signs: Reviewed and stable  Last Vitals:  Filed Vitals:   11/14/14 1015  BP:   Pulse: 72  Temp: 36.7 C  Resp: 14    Complications: No apparent anesthesia complications

## 2014-11-14 NOTE — Anesthesia Preprocedure Evaluation (Addendum)
Anesthesia Evaluation  Patient identified by MRN, date of birth, ID band Patient awake    Reviewed: Allergy & Precautions, NPO status , Patient's Chart, lab work & pertinent test results  History of Anesthesia Complications Negative for: history of anesthetic complications  Airway Mallampati: II  TM Distance: >3 FB Neck ROM: Full    Dental  (+) Edentulous Upper, Edentulous Lower   Pulmonary neg pulmonary ROS,  breath sounds clear to auscultation        Cardiovascular hypertension, Pt. on medications - angina- Past MI and - CHF Rhythm:Regular     Neuro/Psych negative neurological ROS  negative psych ROS   GI/Hepatic negative GI ROS, Neg liver ROS,   Endo/Other  negative endocrine ROS  Renal/GU negative Renal ROS     Musculoskeletal  (+) Arthritis -,   Abdominal   Peds  Hematology negative hematology ROS (+)   Anesthesia Other Findings   Reproductive/Obstetrics                           Anesthesia Physical Anesthesia Plan  ASA: II  Anesthesia Plan: Spinal   Post-op Pain Management:    Induction: Intravenous  Airway Management Planned:   Additional Equipment: None  Intra-op Plan:   Post-operative Plan:   Informed Consent: I have reviewed the patients History and Physical, chart, labs and discussed the procedure including the risks, benefits and alternatives for the proposed anesthesia with the patient or authorized representative who has indicated his/her understanding and acceptance.     Plan Discussed with: CRNA and Surgeon  Anesthesia Plan Comments:         Anesthesia Quick Evaluation

## 2014-11-14 NOTE — Evaluation (Signed)
Physical Therapy Evaluation Patient Details Name: Alan Rios MRN: [id number] DOB: August 01, 1919 Today's Date: 11/14/2014   History of Present Illness  Pt admitted s/p L TKA  Clinical Impression  Pt is s/p TKA resulting in the deficits listed below (see PT Problem List).  Pt will benefit from skilled PT to increase their independence and safety with mobility to allow discharge to the venue listed below.     Follow Up Recommendations Home health PT;Supervision/Assistance - 24 hour    Equipment Recommendations  None recommended by PT    Recommendations for Other Services       Precautions / Restrictions Precautions Precautions: Knee Restrictions Weight Bearing Restrictions: Yes LLE Weight Bearing: Weight bearing as tolerated      Mobility  Bed Mobility Overal bed mobility: Needs Assistance Bed Mobility: Supine to Sit;Sit to Supine     Supine to sit: Min assist;HOB elevated Sit to supine: Min assist;HOB elevated   General bed mobility comments: overall mobilizing well; min A needed for LLE only  Transfers Overall transfer level: Needs assistance Equipment used: Rolling walker (2 wheeled) Transfers: Sit to/from Stand Sit to Stand: Min assist;From elevated surface         General transfer comment: cues for safe hand placement and technique  Ambulation/Gait Ambulation/Gait assistance: Min assist Ambulation Distance (Feet): 10 Feet Assistive device: Rolling walker (2 wheeled) Gait Pattern/deviations: Step-to pattern   Gait velocity interpretation: Below normal speed for age/gender General Gait Details: min cues for technique; pt became nauseous after ~7' amb therefore returned to bed  Stairs            Wheelchair Mobility    Modified Rankin (Stroke Patients Only)       Balance Overall balance assessment: Needs assistance Sitting-balance support: Bilateral upper extremity supported;Feet supported Sitting balance-Leahy Scale: Fair     Standing  balance support: During functional activity;Bilateral upper extremity supported Standing balance-Leahy Scale: Fair                               Pertinent Vitals/Pain Pain Assessment: 0-10 Pain Score: 8  Pain Location: L knee Pain Intervention(s): Monitored during session;Repositioned    Home Living Family/patient expects to be discharged to:: Private residence Living Arrangements: Spouse/significant other Available Help at Discharge: Family;Available 24 hours/day (plans to d/c to daughters house) Type of Home: House Home Access: Stairs to enter Entrance Stairs-Rails: Psychiatric nurse of Steps: 4 Home Layout: One level Home Equipment: Environmental consultant - 2 wheels      Prior Function Level of Independence: Independent with assistive device(s)               Hand Dominance        Extremity/Trunk Assessment               Lower Extremity Assessment: Generalized weakness         Communication   Communication: No difficulties  Cognition Arousal/Alertness: Awake/alert Behavior During Therapy: WFL for tasks assessed/performed                        General Comments      Exercises Total Joint Exercises Ankle Circles/Pumps: Both;10 reps;Supine Quad Sets: Left;Supine;10 reps Heel Slides: Left;5 reps;AAROM;Supine Straight Leg Raises: Left;AAROM;5 reps;Supine      Assessment/Plan    PT Assessment Patient needs continued PT services  PT Diagnosis Difficulty walking;Abnormality of gait;Generalized weakness;Acute pain   PT Problem List Decreased strength;Decreased  activity tolerance;Decreased range of motion;Decreased mobility;Decreased balance;Decreased knowledge of use of DME;Decreased safety awareness;Decreased knowledge of precautions;Pain  PT Treatment Interventions DME instruction;Gait training;Stair training;Functional mobility training;Therapeutic activities;Therapeutic exercise;Balance training;Neuromuscular  re-education;Patient/family education   PT Goals (Current goals can be found in the Care Plan section) Acute Rehab PT Goals Patient Stated Goal: to go to daughters home and avoid SNF if possible PT Goal Formulation: With patient/family Time For Goal Achievement: 11/21/14 Potential to Achieve Goals: Good    Frequency 7X/week   Barriers to discharge        Co-evaluation               End of Session   Activity Tolerance: Patient tolerated treatment well;Treatment limited secondary to medical complications (Comment) (nausea) Patient left: in bed;with call bell/phone within reach;with family/visitor present Nurse Communication: Mobility status;Patient requests pain meds         Time: 2951-8841 PT Time Calculation (min) (ACUTE ONLY): 37 min   Charges:   PT Evaluation $Initial PT Evaluation Tier I: 1 Procedure PT Treatments $Gait Training: 8-22 mins $Therapeutic Exercise: 8-22 mins   PT G Codes:       Laureen Abrahams, PT, DPT 11/14/2014 2:10 PM [phone]

## 2014-11-14 NOTE — Op Note (Signed)
PATIENT ID:      Alan Rios  MRN:     [id number] DOB/AGE:    1918-11-25 / 81 y.o.       OPERATIVE REPORT    DATE OF PROCEDURE:  11/14/2014       PREOPERATIVE DIAGNOSIS:   OSTEOARTHRITIS LEFT KNEE      There is no weight on file to calculate BMI.                                                        POSTOPERATIVE DIAGNOSIS:   OSTEOARTHRITIS LEFT KNEE                                                                      PROCEDURE:  Procedure(s): TOTAL KNEE ARTHROPLASTY Using Depuy Sigma RP implants #4L Femur, #4Tibia, 24mm Sigma RP bearing, 38 Patella     SURGEON: Jalin Erpelding J    ASSISTANT:   Eric K[employer]   (Present and scrubbed throughout the case, critical for assistance with exposure, retraction, instrumentation, and closure.)         ANESTHESIA: GET, ACB, Exparel  DRAINS: 2 medium hemovac in knee   TOURNIQUET TIME: 40JWJ   COMPLICATIONS:  None     SPECIMENS: None   INDICATIONS FOR PROCEDURE: The patient has  OSTEOARTHRITIS LEFT KNEE, varus deformities, XR shows bone on bone arthritis. Patient has failed all conservative measures including anti-inflammatory medicines, narcotics, attempts at  exercise and weight loss, cortisone injections and viscosupplementation.  Risks and benefits of surgery have been discussed, questions answered.   DESCRIPTION OF PROCEDURE: The patient identified by armband, received  IV antibiotics, in the holding area at [facility]. Patient taken to the operating room, appropriate anesthetic  monitors were attached, and general endotracheal anesthesia induced with  the patient in supine position, Foley catheter was inserted. Tourniquet  applied high to the operative thigh. Lateral post and foot positioner  applied to the table, the lower extremity was then prepped and draped  in usual sterile fashion from the ankle to the tourniquet. Time-out procedure was performed. The limb was wrapped with an Esmarch bandage and the tourniquet  inflated to 350 mmHg. We began the operation by making the anterior midline incision starting at handbreadth above the patella going over the patella 1 cm medial to and  4 cm distal to the tibial tubercle. Small bleeders in the skin and the  subcutaneous tissue identified and cauterized. Transverse retinaculum was incised and reflected medially and a medial parapatellar arthrotomy was accomplished. the patella was everted and theprepatellar fat pad resected. The superficial medial collateral  ligament was then elevated from anterior to posterior along the proximal  flare of the tibia and anterior half of the menisci resected. The knee was hyperflexed exposing bone on bone arthritis. Peripheral and notch osteophytes as well as the cruciate ligaments were then resected. We continued to  work our way around posteriorly along the proximal tibia, and externally  rotated the tibia subluxing it out from underneath the femur. A McHale  retractor was placed through the  notch and a lateral Hohmann retractor  placed, and we then drilled through the proximal tibia in line with the  axis of the tibia followed by an intramedullary guide rod and 2-degree  posterior slope cutting guide. The tibial cutting guide was pinned into place  allowing resection of 0 mm of bone medially and about 11 mm of bone  laterally because of her varus deformity. Satisfied with the tibial resection, we then  entered the distal femur 2 mm anterior to the PCL origin with the  intramedullary guide rod and applied the distal femoral cutting guide  set at 63mm, with 5 degrees of valgus. This was pinned along the  epicondylar axis. At this point, the distal femoral cut was accomplished without difficulty. We then sized for a #4L femoral component and pinned the guide in 3 degrees of external rotation.The chamfer cutting guide was pinned into place. The anterior, posterior, and chamfer cuts were accomplished without difficulty followed by   the box cutting guide and the box cut. We also removed posterior osteophytes from the posterior femoral condyles. At this  time, the knee was brought into full extension. We checked our  extension and flexion gaps and found them symmetric at 56mm.  The patella thickness measured at 35 mm. We set the cutting guide at 15 and removed the posterior 10 mm  of the patella, sized for a 38 button and drilled the lollipop. The knee  was then once again hyperflexed exposing the proximal tibia. We sized for a #4 tibial base plate, applied the smokestack and the conical reamer followed by the the Delta fin keel punch. We then hammered into place the Sigma RP trial femoral component, inserted a 10-mm trial bearing, trial patellar button, and took the knee through range of motion from 0-130 degrees. No thumb pressure was required for patellar  tracking. At this point, all trial components were removed, a double batch of DePuy HV cement with 1500 mg of Zinacef was mixed and applied to all bony metallic mating surfaces except for the posterior condyles of the femur itself. In order, we  hammered into place the tibial tray and removed excess cement, the femoral component and removed excess cement, a 10-mm Sigma RP bearing  was inserted, and the knee brought to full extension with compression.  The patellar button was clamped into place, and excess cement  removed. While the cement cured the wound was irrigated out with normal saline solution pulse lavage, and medium Hemovac drains were placed from an anterolateral  approach. Ligament stability and patellar tracking were checked and found to be excellent. The parapatellar arthrotomy was closed with  running #1 Vicryl suture. The subcutaneous tissue with 0 and 2-0 undyed  Vicryl suture, and the skin with skin staples. A dressing of Xeroform,  4 x 4, dressing sponges, Webril, and Ace wrap applied. The patient  awakened, extubated, and taken to recovery room without  difficulty.   Alan Rios J 11/14/2014, 8:51 AM

## 2014-11-14 NOTE — Transfer of Care (Signed)
Immediate Anesthesia Transfer of Care Note  Patient: Alan Rios  Procedure(s) Performed: Procedure(s): TOTAL KNEE ARTHROPLASTY (Left)  Patient Location: PACU  Anesthesia Type:Spinal  Level of Consciousness: awake, alert , oriented and patient cooperative  Airway & Oxygen Therapy: Patient Spontanous Breathing and Patient connected to nasal cannula oxygen  Post-op Assessment: Report given to RN, Post -op Vital signs reviewed and stable and Patient moving all extremities  Post vital signs: Reviewed and stable  Last Vitals:  Filed Vitals:   11/14/14 0922  BP:   Pulse:   Temp: 36.6 C  Resp:     Complications: No apparent anesthesia complications

## 2014-11-14 NOTE — Progress Notes (Signed)
Utilization review completed.  

## 2014-11-14 NOTE — Anesthesia Procedure Notes (Signed)
Spinal Patient location during procedure: OR Staffing Anesthesiologist: Brehanna Deveny, CHRIS Preanesthetic Checklist Completed: patient identified, surgical consent, pre-op evaluation, timeout performed, IV checked, risks and benefits discussed and monitors and equipment checked Spinal Block Patient position: sitting Prep: site prepped and draped and DuraPrep Patient monitoring: heart rate, cardiac monitor, continuous pulse ox and blood pressure Approach: midline Location: L4-5 Injection technique: single-shot Needle Needle type: Pencan  Needle gauge: 24 G Needle length: 10 cm Assessment Sensory level: T8

## 2014-11-14 NOTE — Progress Notes (Signed)
Orthopedic Tech Progress Note Patient Details:  Alan Rios Alan Rios 01/09/1919 [id number]  CPM Left Knee CPM Left Knee: On Left Knee Flexion (Degrees): 60 Left Knee Extension (Degrees): 0 Additional Comments: trapeze bar patient helper Viewed order from doctor's order list  Alan Rios 11/14/2014, 10:14 AM

## 2014-11-15 ENCOUNTER — Encounter (HOSPITAL_COMMUNITY): Payer: Self-pay | Admitting: Orthopedic Surgery

## 2014-11-15 LAB — CBC
HCT: 42 % (ref 39.0–52.0)
Hemoglobin: 14 g/dL (ref 13.0–17.0)
MCH: 33.3 pg (ref 26.0–34.0)
MCHC: 33.3 g/dL (ref 30.0–36.0)
MCV: 99.8 fL (ref 78.0–100.0)
PLATELETS: 134 10*3/uL — AB (ref 150–400)
RBC: 4.21 MIL/uL — AB (ref 4.22–5.81)
RDW: 13.5 % (ref 11.5–15.5)
WBC: 8.4 10*3/uL (ref 4.0–10.5)

## 2014-11-15 NOTE — Clinical Social Work Note (Signed)
CSW attempted to meet with patient and patient's family at bedside regarding discharge disposition. Patient currently working with PT. RN reported patient to be discharged to [facility] once medically stable for discharge. CSW to continue to pursue [facility] SNF placement for patient at time of discharge.   CSW to follow-up with full assessment.  Lubertha Sayres, [place] [phone]) Licensed Clinical Social Worker Orthopedics [phone]) and Surgical [phone])

## 2014-11-15 NOTE — Progress Notes (Signed)
Orthopedic Tech Progress Note Patient Details:  Alan Rios 1919/01/30 [id number]  Patient ID: Bard Herbert, male   DOB: Aug 22, 1919, 82 y.o.   MRN: [id number] Placed pt's lle in cpm @0 -60 degrees @1440   Hildred Priest 11/15/2014, 2:37 PM

## 2014-11-15 NOTE — Progress Notes (Signed)
Patient ID: Alan Rios, male   DOB: 01-28-1919, 81 y.o.   MRN: [id number] PATIENT ID: Alan Rios  MRN: [id number]  DOB/AGE:  07/04/1919 / 81 y.o.  1 Day Post-Op Procedure(s) (LRB): TOTAL KNEE ARTHROPLASTY (Left)    PROGRESS NOTE Subjective: Patient is alert, oriented, x1 Nausea, no Vomiting, yes passing gas, no Bowel Movement. Taking PO well. Denies SOB, Chest or Calf Pain. Using Incentive Spirometer, PAS in place. Ambulate WBAT today, CPM 0-40 Patient reports pain as 5 on 0-10 scale  .    Objective: Vital signs in last 24 hours: Filed Vitals:   11/15/14 0000 11/15/14 0139 11/15/14 0400 11/15/14 0558  BP:  168/70  143/69  Pulse:  108  111  Temp:  99.9 F (37.7 C)  99.4 F (37.4 C)  TempSrc:      Resp: 16 16 16 16   Height:      Weight:      SpO2: 97% 97% 98% 98%      Intake/Output from previous day: I/O last 3 completed shifts: In: 1400 [P.O.:100; I.V.:1300] Out: 250 [Urine:100; Drains:150]   Intake/Output this shift:     LABORATORY DATA:  Recent Labs  11/15/14 0626  WBC 8.4  HGB 14.0  HCT 42.0  PLT 134*    Examination: Neurologically intact ABD soft Neurovascular intact Sensation intact distally Intact pulses distally Dorsiflexion/Plantar flexion intact Incision: dressing C/D/I No cellulitis present Compartment soft} Blood and plasma separated in drain indicating minimal recent drainage, drain pulled without difficulty.  Assessment:   1 Day Post-Op Procedure(s) (LRB): TOTAL KNEE ARTHROPLASTY (Left) ADDITIONAL DIAGNOSIS: Expected Acute Blood Loss Anemia,   Plan: PT/OT WBAT, CPM 5/hrs day until ROM 0-90 degrees, then D/C CPM DVT Prophylaxis:  SCDx72hrs, ASA 325 mg BID x 2 weeks DISCHARGE PLAN: Skilled Nursing Facility/Rehab, [address] DISCHARGE NEEDS: HHPT, CPM, Walker and 3-in-1 comode seat     Tashia Leiterman J 11/15/2014, 7:34 AM

## 2014-11-15 NOTE — Clinical Social Work Placement (Signed)
Clinical Social Work Department CLINICAL SOCIAL WORK PLACEMENT NOTE 11/15/2014  Patient:  Alan Rios, Alan Rios  Account Number:  [id number] Admit date:  11/14/2014  Clinical Social Worker:  Durward Fortes, CLINICAL SOCIAL WORKER  Date/time:  11/15/2014 01:06 PM  Clinical Social Work is seeking post-discharge placement for this patient at the following level of care:   SKILLED NURSING   (*CSW will update this form in Epic as items are completed)   11/15/2014  Patient/family provided with [facility] Department of Clinical Social Work's list of facilities offering this level of care within the geographic area requested by the patient (or if unable, by the patient's family).  11/15/2014  Patient/family informed of their freedom to choose among providers that offer the needed level of care, that participate in Medicare, Medicaid or managed care program needed by the patient, have an available bed and are willing to accept the patient.  11/15/2014  Patient/family informed of [facility]' ownership interest in [facility], as well as of the fact that they are under no obligation to receive care at this facility.  PASARR submitted to EDS on 11/15/2014 PASARR number received on 11/15/2014  FL2 transmitted to all facilities in geographic area requested by pt/family on  11/15/2014 FL2 transmitted to all facilities within larger geographic area on   Patient informed that his/her managed care company has contracts with or will negotiate with  certain facilities, including the following:     Patient/family informed of bed offers received:  11/15/2014 Patient chooses bed at [facility] Physician recommends and patient chooses bed at    Patient to be transferred to [facility] on  11/16/2014 Patient to be transferred to facility by [facility] Patient and family notified of transfer on 11/16/2014 Name of family member notified:  Patient's daughter, Geraldo Pitter, and patient's granddaughter,  [place].  The following physician request were entered in Epic:   Additional Comments:   Kierra S. [address], BSW-Intern  Lubertha Sayres, [place] ([phone]) Licensed Clinical Social Worker Orthopedics [phone]) and Surgical [phone])

## 2014-11-15 NOTE — Progress Notes (Signed)
Physical Therapy Treatment Patient Details Name: Alan Rios MRN: [id number] DOB: 02-17-1919 Today's Date: 11/15/2014    History of Present Illness Pt admitted s/p L TKA    PT Comments    Pt very lethargic during session, unable to follow simple one step commands and was unable to state answers to orientation questions.  Requires max A currently for all activity.  Did not attempt gait for safety issues.  CSW made aware of new plan for SNF, as currently family would be unable to manage this level of assist.     Follow Up Recommendations  SNF;Supervision/Assistance - 24 hour     Equipment Recommendations  None recommended by PT    Recommendations for Other Services       Precautions / Restrictions Precautions Precautions: Knee Precaution Comments: Reviewed knee precautions with family and no pillow under knee Restrictions Weight Bearing Restrictions: No LLE Weight Bearing: Weight bearing as tolerated    Mobility  Bed Mobility Overal bed mobility: Needs Assistance Bed Mobility: Supine to Sit Rolling: Max assist Sidelying to sit: Max assist;HOB elevated Supine to sit: Max assist Sit to supine: Max assist;HOB elevated   General bed mobility comments: Pt continues to require tactile and verbal cues to initiate any task, however required physical assist for getting LEs out of bed, as well as to elevate trunk.    Transfers Overall transfer level: Needs assistance Equipment used: Rolling walker (2 wheeled) Transfers: Sit to/from [facility] Sit to Stand: Max assist Stand pivot transfers: Max assist       General transfer comment: Pt unable to stand with use of RW therefore assisted with standing while under R axilla for increased support at max A while nurse tech assisted with peri care and donned new chuck pad for bed due to urine incontinence.  Pt then transferred to bedside commode via stand/squat pivot transfer (again with RW) at max A level.     Ambulation/Gait Ambulation/Gait assistance:  (did not occur due to safety)               Stairs            Wheelchair Mobility    Modified Rankin (Stroke Patients Only)       Balance Overall balance assessment: Needs assistance Sitting-balance support: Feet supported Sitting balance-Leahy Scale: Poor Sitting balance - Comments: Pt intermittently mod to S for sitting balance   Standing balance support: During functional activity Standing balance-Leahy Scale: Zero                      Cognition Arousal/Alertness: Lethargic Behavior During Therapy: Flat affect Overall Cognitive Status: Impaired/Different from baseline Area of Impairment: Orientation;Attention;Memory;Safety/judgement;Awareness;Problem solving Orientation Level: Disoriented to;Person;Place;Time;Situation Current Attention Level: Sustained Memory: Decreased recall of precautions;Decreased short-term memory   Safety/Judgement: Decreased awareness of safety;Decreased awareness of deficits Awareness: Intellectual Problem Solving: Slow processing;Decreased initiation;Requires verbal cues;Difficulty sequencing;Requires tactile cues General Comments: Pt continued to be very fatigued during PT session, unable to answer any questions and would perseverate on answering "yes maam" even when asked where he was.      Exercises      General Comments        Pertinent Vitals/Pain Pain Assessment: Faces Faces Pain Scale: Hurts even more Pain Location: L knee with any assisted movement Pain Descriptors / Indicators: Aching Pain Intervention(s): Monitored during session    Home Living  Prior Function            PT Goals (current goals can now be found in the care plan section) Acute Rehab PT Goals Patient Stated Goal: none stated, patient unable PT Goal Formulation: With patient/family Time For Goal Achievement: 11/21/14 Potential to Achieve Goals:  Good Progress towards PT goals: Not progressing toward goals - comment (note marked decrease in functional mobility )    Frequency  7X/week    PT Plan Discharge plan needs to be updated    Co-evaluation             End of Session Equipment Utilized During Treatment: Gait belt Activity Tolerance: Patient limited by fatigue;Patient limited by lethargy Patient left: in bed;with call bell/phone within reach;with nursing/sitter in room;with family/visitor present     Time: 4401-0272 PT Time Calculation (min) (ACUTE ONLY): 32 min  Charges:  $Therapeutic Activity: 23-37 mins                    G Codes:      Denice Bors 11/15/2014, 7:23 PM

## 2014-11-15 NOTE — Progress Notes (Signed)
PT Cancellation Note  Patient Details Name: Alan Rios MRN: [id number] DOB: Oct 29, 1918   Cancelled Treatment:    Reason Eval/Treat Not Completed: Fatigue/lethargy limiting ability to participate. Pt unable to be aroused to participate in skilled PT session this AM. Notified RN and she is aware. Will attempt to follow up as able.  Monitor in future session if home is still an appropriate d/c plan or if SNF is appropriate recommendation. Question if pt's wife will be able to provide needed care.    [facility] 11/15/2014, 11:07 AM

## 2014-11-15 NOTE — Evaluation (Signed)
Occupational Therapy Evaluation Patient Details Name: Alan Rios MRN: [id number] DOB: [birth year] Today's Date: 11/15/2014    History of Present Illness Pt admitted s/p L TKA   Clinical Impression   Patient mod I PTA. Patient currently requires up to total assist for functional tasks/ADLs. Patient will benefit from acute OT to increase overall independence in the areas of ADLs, functional mobility, and overall safety in order to safely discharge to SNF.     Follow Up Recommendations  SNF;Supervision/Assistance - 24 hour    Equipment Recommendations   (TBD)    Recommendations for Other Services  None at this time.      Precautions / Restrictions Precautions Precautions: Knee Precaution Comments: Reviewed knee precautions with family and no pillow under knee Restrictions Weight Bearing Restrictions: Yes LLE Weight Bearing: Weight bearing as tolerated      Mobility Bed Mobility Overal bed mobility: Needs Assistance Bed Mobility: Rolling;Sidelying to Sit Rolling: Max assist Sidelying to sit: Max assist;HOB elevated   Sit to supine: Max assist;HOB elevated   General bed mobility comments: Patient required verbal and tactile cueing for all mobility  Transfers General transfer comment: Unable to perform secondary to cognition and lethargy    Balance Overall balance assessment: Needs assistance Sitting-balance support: Bilateral upper extremity supported;Feet supported Sitting balance-Leahy Scale: Zero     ADL Overall ADL's : Needs assistance/impaired     Grooming: Total assistance;Sitting   Upper Body Bathing: Total assistance;Sitting   Lower Body Bathing: Total assistance;Bed level   Upper Body Dressing : Total assistance;Sitting   Lower Body Dressing: Total assistance;Bed level                 General ADL Comments: Patient unable to fully participate in ADL secondary to impaired cognition and fatigue. Patient required total assist to maintain  sitting balance EOB.      Pertinent Vitals/Pain Pain Assessment: Faces Faces Pain Scale: Hurts even more Pain Location: Pt unable to state location or score Pain Descriptors / Indicators: Grimacing Pain Intervention(s): Monitored during session     Hand Dominance Right   Extremity/Trunk Assessment Upper Extremity Assessment Upper Extremity Assessment: Difficult to assess due to impaired cognition   Lower Extremity Assessment Lower Extremity Assessment: Defer to PT evaluation       Communication Communication Communication: No difficulties   Cognition Arousal/Alertness: Awake/alert Behavior During Therapy: WFL for tasks assessed/performed Overall Cognitive Status: Impaired/Different from baseline Area of Impairment: Orientation;Attention;Memory;Safety/judgement;Awareness;Problem solving Orientation Level: Disoriented to;Person;Place;Time;Situation Current Attention Level: Sustained Memory: Decreased recall of precautions;Decreased short-term memory   Safety/Judgement: Decreased awareness of safety;Decreased awareness of deficits Awareness: Intellectual Problem Solving: Slow processing;Decreased initiation;Requires verbal cues;Difficulty sequencing;Requires tactile cues General Comments: Patient barely able to keep eyes open. Patient unable to give pain score even after asked multiple times. Patient unable to follow simple one-step commands.               Home Living Family/patient expects to be discharged to:: Private residence Living Arrangements: Children Available Help at Discharge: Family;Available 24 hours/day (plan to discharge > daughters house) Type of Home: House Home Access: Stairs to enter [employer] of Steps: 4 Entrance Stairs-Rails: Right;Left Home Layout: One level     Bathroom Shower/Tub: Corporate investment banker: Standard     Home Equipment: Environmental consultant - 2 wheels;Bedside commode          Prior  Functioning/Environment Level of Independence: Independent with assistive device(s)      OT Diagnosis: Generalized weakness;Acute pain  OT Problem List: Decreased strength;Decreased range of motion;Decreased activity tolerance;Impaired balance (sitting and/or standing);Decreased coordination;Decreased knowledge of use of DME or AE;Decreased safety awareness;Decreased knowledge of precautions;Pain   OT Treatment/Interventions: Self-care/ADL training;Therapeutic exercise;Energy conservation;DME and/or AE instruction;Therapeutic activities;Patient/family education;Balance training    OT Goals(Current goals can be found in the care plan section) Acute Rehab OT Goals Patient Stated Goal: none stated, patient unable OT Goal Formulation: With patient Time For Goal Achievement: 11/22/14 Potential to Achieve Goals: Fair ADL Goals Pt Will Perform Grooming: with supervision;sitting Pt Will Perform Upper Body Bathing: with supervision;sitting Pt Will Perform Lower Body Bathing: with mod assist;with adaptive equipment;sit to/from stand Pt Will Perform Upper Body Dressing: with supervision;sitting Pt Will Perform Lower Body Dressing: with mod assist;sit to/from stand;with adaptive equipment Pt Will Transfer to Toilet: with min assist;ambulating Additional ADL Goal #1: Patient will sit EOB for ADL/functional activity with supervision for at least 15 minutes in order to increase independence with ADLs  OT Frequency: Min 2X/week   Barriers to D/C: Decreased caregiver support          End of Session CPM Left Knee CPM Left Knee: Off  Nurse Communication: Mobility status;Other (comment) (change in functional status since PT eval yesterday)  Activity Tolerance: Patient limited by lethargy;Patient limited by pain Patient left: in bed;with call bell/phone within reach;with family/visitor present   Time: 2683-4196 OT Time Calculation (min): 19 min Charges:  OT General Charges $OT Visit: 1  Procedure OT Evaluation $Initial OT Evaluation Tier I: 1 Procedure  Delfina Schreurs , MS, OTR/L, CLT Pager: [phone]  11/15/2014, 2:15 PM

## 2014-11-16 DIAGNOSIS — M25562 Pain in left knee: Secondary | ICD-10-CM | POA: Diagnosis not present

## 2014-11-16 DIAGNOSIS — R41841 Cognitive communication deficit: Secondary | ICD-10-CM | POA: Diagnosis not present

## 2014-11-16 DIAGNOSIS — K5900 Constipation, unspecified: Secondary | ICD-10-CM | POA: Diagnosis not present

## 2014-11-16 DIAGNOSIS — E538 Deficiency of other specified B group vitamins: Secondary | ICD-10-CM | POA: Diagnosis not present

## 2014-11-16 DIAGNOSIS — M6281 Muscle weakness (generalized): Secondary | ICD-10-CM | POA: Diagnosis not present

## 2014-11-16 DIAGNOSIS — M129 Arthropathy, unspecified: Secondary | ICD-10-CM | POA: Diagnosis not present

## 2014-11-16 DIAGNOSIS — R2681 Unsteadiness on feet: Secondary | ICD-10-CM | POA: Diagnosis not present

## 2014-11-16 DIAGNOSIS — Z471 Aftercare following joint replacement surgery: Secondary | ICD-10-CM | POA: Diagnosis not present

## 2014-11-16 DIAGNOSIS — N400 Enlarged prostate without lower urinary tract symptoms: Secondary | ICD-10-CM | POA: Diagnosis not present

## 2014-11-16 DIAGNOSIS — M1712 Unilateral primary osteoarthritis, left knee: Secondary | ICD-10-CM | POA: Diagnosis not present

## 2014-11-16 DIAGNOSIS — R1311 Dysphagia, oral phase: Secondary | ICD-10-CM | POA: Diagnosis not present

## 2014-11-16 DIAGNOSIS — S83105A Unspecified dislocation of left knee, initial encounter: Secondary | ICD-10-CM | POA: Diagnosis not present

## 2014-11-16 DIAGNOSIS — I10 Essential (primary) hypertension: Secondary | ICD-10-CM | POA: Diagnosis not present

## 2014-11-16 DIAGNOSIS — R278 Other lack of coordination: Secondary | ICD-10-CM | POA: Diagnosis not present

## 2014-11-16 DIAGNOSIS — Z96652 Presence of left artificial knee joint: Secondary | ICD-10-CM | POA: Diagnosis not present

## 2014-11-16 DIAGNOSIS — D62 Acute posthemorrhagic anemia: Secondary | ICD-10-CM | POA: Diagnosis not present

## 2014-11-16 LAB — CBC
HCT: 38.5 % — ABNORMAL LOW (ref 39.0–52.0)
Hemoglobin: 12.7 g/dL — ABNORMAL LOW (ref 13.0–17.0)
MCH: 32.6 pg (ref 26.0–34.0)
MCHC: 33 g/dL (ref 30.0–36.0)
MCV: 98.7 fL (ref 78.0–100.0)
PLATELETS: 121 10*3/uL — AB (ref 150–400)
RBC: 3.9 MIL/uL — ABNORMAL LOW (ref 4.22–5.81)
RDW: 13.8 % (ref 11.5–15.5)
WBC: 9.9 10*3/uL (ref 4.0–10.5)

## 2014-11-16 NOTE — Progress Notes (Signed)
Rept called to Transport planner at [facility]

## 2014-11-16 NOTE — Progress Notes (Signed)
PT Cancellation Note  Patient Details Name: MAZIAH KEELING MRN: [id number] DOB: 1918-11-03   Cancelled Treatment:    Reason Eval/Treat Not Completed: Other (comment). Attempted to see patient however notified by RN that ambulance is on the way to take patient to SNF. Requested to hold.    Jacqualyn Posey 11/16/2014, 11:21 AM

## 2014-11-16 NOTE — Progress Notes (Signed)
PATIENT ID: SHAFIQ LARCH  MRN: [id number]  DOB/AGE:  1919/05/26 / 81 y.o.  2 Days Post-Op Procedure(s) (LRB): TOTAL KNEE ARTHROPLASTY (Left)    PROGRESS NOTE Subjective: Patient is alert, oriented, no Nausea, no Vomiting, yes passing gas, no Bowel Movement. Taking PO well. Denies SOB, Chest or Calf Pain. Using Incentive Spirometer, PAS in place. Ambulate WBAT, CPM 0-40 Patient reports pain as 8 on 0-10 scale  .    Objective: Vital signs in last 24 hours: Filed Vitals:   11/15/14 2200 11/16/14 0000 11/16/14 0339 11/16/14 0555  BP: 99/59   101/59  Pulse: 112   108  Temp: 99.2 F (37.3 C)   99.4 F (37.4 C)  TempSrc:      Resp: 16 16 18 16   Height:      Weight:      SpO2: 98% 98% 95% 99%      Intake/Output from previous day: I/O last 3 completed shifts: In: 560 [P.O.:560] Out: 300 [Urine:300]   Intake/Output this shift:     LABORATORY DATA:  Recent Labs  11/15/14 0626 11/16/14 0541  WBC 8.4 9.9  HGB 14.0 12.7*  HCT 42.0 38.5*  PLT 134* 121*    Examination: Neurologically intact Neurovascular intact Sensation intact distally Intact pulses distally Dorsiflexion/Plantar flexion intact Incision: dressing C/D/I No cellulitis present Compartment soft}  Assessment:   2 Days Post-Op Procedure(s) (LRB): TOTAL KNEE ARTHROPLASTY (Left) ADDITIONAL DIAGNOSIS: Expected Acute Blood Loss Anemia,  Plan: PT/OT WBAT, CPM 5/hrs day until ROM 0-90 degrees, then D/C CPM DVT Prophylaxis:  SCDx72hrs, ASA 325 mg BID x 2 weeks DISCHARGE PLAN: Skilled Nursing Facility/Rehab , [facility] when bed available DISCHARGE NEEDS: HHPT, HHRN, CPM, Walker and 3-in-1 comode seat     Ariel Wingrove R 11/16/2014, 8:00 AM

## 2014-11-16 NOTE — Progress Notes (Signed)
11/16/14 PT recomended SNF. Referral made to CSW. CSW working on d/c to SNF.

## 2014-11-16 NOTE — Discharge Summary (Signed)
Patient ID: Alan Rios MRN: [id number] DOB/AGE: 1919-05-20 81 y.o.  Admit date: 11/14/2014 Discharge date: 11/16/2014  Admission Diagnoses:  Active Problems:   Arthritis of left knee   Discharge Diagnoses:  Same  Past Medical History  Diagnosis Date  . Cancer of prostate   . Arthritis   . Joint swelling   . Joint pain     Surgeries: Procedure(s): TOTAL KNEE ARTHROPLASTY on 11/14/2014   Consultants:    Discharged Condition: Improved  Hospital Course: Alan Rios is an 81 y.o. male who was admitted 11/14/2014 for operative treatment of<principal problem not specified>. Patient has severe unremitting pain that affects sleep, daily activities, and work/hobbies. After pre-op clearance the patient was taken to the operating room on 11/14/2014 and underwent  Procedure(s): TOTAL KNEE ARTHROPLASTY.    Patient was given perioperative antibiotics: Anti-infectives    Start     Dose/Rate Route Frequency Ordered Stop   11/14/14 0715  vancomycin (VANCOCIN) IVPB 1000 mg/200 mL premix     1,000 mg 200 mL/hr over 60 Minutes Intravenous  Once 11/14/14 9470 11/14/14 0825       Patient was given sequential compression devices, early ambulation, and chemoprophylaxis to prevent DVT.  Patient benefited maximally from hospital stay and there were no complications.    Recent vital signs: Patient Vitals for the past 24 hrs:  BP Temp Temp src Pulse Resp SpO2  11/16/14 0555 (!) 101/59 mmHg 99.4 F (37.4 C) - (!) 108 16 99 %  11/16/14 0339 - - - - 18 95 %  11/16/14 0000 - - - - 16 98 %  11/15/14 2200 (!) 99/59 mmHg 99.2 F (37.3 C) - (!) 112 16 98 %  11/15/14 2000 - - - - 16 98 %  11/15/14 1600 - - - - 16 98 %  11/15/14 1356 104/81 mmHg (!) 100.4 F (38 C) Oral 76 16 98 %  11/15/14 1200 - - - - 16 98 %     Recent laboratory studies:  Recent Labs  11/15/14 0626 11/16/14 0541  WBC 8.4 9.9  HGB 14.0 12.7*  HCT 42.0 38.5*  PLT 134* 121*     Discharge Medications:      Medication List    STOP taking these medications        ibuprofen 200 MG tablet  Commonly known as:  ADVIL,MOTRIN     meloxicam 15 MG tablet  Commonly known as:  MOBIC      TAKE these medications        aspirin EC 325 MG tablet  Take 1 tablet (325 mg total) by mouth 2 (two) times daily.     bethanechol 50 MG tablet  Commonly known as:  URECHOLINE  Take 50 mg by mouth 2 (two) times daily with a meal.     hydrochlorothiazide 12.5 MG tablet  Commonly known as:  HYDRODIURIL  Take 12.5 mg by mouth daily.     HYDROcodone-acetaminophen 5-325 MG per tablet  Commonly known as:  NORCO  Take 1 tablet by mouth every 6 (six) hours as needed.     tamsulosin 0.4 MG Caps capsule  Commonly known as:  FLOMAX  Take 0.4 mg by mouth 2 (two) times daily with a meal.     tizanidine 2 MG capsule  Commonly known as:  ZANAFLEX  Take 1 capsule (2 mg total) by mouth 3 (three) times daily.     VITAMIN B12 PO  Take 1 tablet by mouth every other day.  Diagnostic Studies: No results found.  Disposition: 01-Home or Self Care      Discharge Instructions    CPM    Complete by:  As directed   Continuous passive motion machine (CPM):      Use the CPM from 0 to 60  for 5 hours per day.      You may increase by 10 degrees per day.  You may break it up into 2 or 3 sessions per day.      Use CPM for 2 weeks or until you are told to stop.     Call MD / Call 911    Complete by:  As directed   If you experience chest pain or shortness of breath, CALL 911 and be transported to the hospital emergency room.  If you develope a fever above 101 F, pus (white drainage) or increased drainage or redness at the wound, or calf pain, call your surgeon's office.     Change dressing    Complete by:  As directed   Change dressing on day 5, then change the dressing daily with sterile 4 x 4 inch gauze dressing and apply TED hose.  You may clean the incision with alcohol prior to redressing.     Constipation  Prevention    Complete by:  As directed   Drink plenty of fluids.  Prune juice may be helpful.  You may use a stool softener, such as Colace (over the counter) 100 mg twice a day.  Use MiraLax (over the counter) for constipation as needed.     Diet - low sodium heart healthy    Complete by:  As directed      Discharge instructions    Complete by:  As directed   Follow up in office with Dr. Mayer Camel in 2 weeks.     Driving restrictions    Complete by:  As directed   No driving for 2 weeks     Increase activity slowly as tolerated    Complete by:  As directed      Patient may shower    Complete by:  As directed   You may shower without a dressing once there is no drainage.  Do not wash over the wound.  If drainage remains, cover wound with plastic wrap and then shower.           Follow-up Information    Follow up with Kerin Salen, MD In 2 weeks.   Specialty:  Orthopedic Surgery   Contact information:   [place] [address] [phone]        Signed: Hardin Negus ERIC R 11/16/2014, 8:03 AM

## 2014-11-16 NOTE — Discharge Planning (Signed)
Patient to be discharged to [facility]. Patient's granddaughter, [place], updated regarding discharge.  [employer] Medicare authorization: [id number]  Facility: [facility] Report number: [id number] Transportation: EMS ([address]  Lubertha Sayres, [place] [phone]) Licensed Clinical Social Worker Orthopedics [phone]) and Surgical [phone])

## 2014-11-16 NOTE — Clinical Social Work Psychosocial (Signed)
Clinical Social Work Department BRIEF PSYCHOSOCIAL ASSESSMENT 11/16/2014  Patient:  Alan Rios, Alan Rios     Account Number:  [id number]     Admit date:  11/14/2014  Clinical Social Worker:  Delrae Sawyers  Date/Time:  11/16/2014 10:46 AM  Referred by:  Physician  Date Referred:  11/16/2014 Referred for  SNF Placement   Other Referral:   none.   Interview type:  Family Other interview type:   CSW met with patient's daughter and granddaughter at bedside.    PSYCHOSOCIAL DATA Living Status:  WIFE Admitted from facility:   Level of care:   Primary support name:  Alan Rios Primary support relationship to patient:  CHILD, ADULT Degree of support available:   Strong support system.    CURRENT CONCERNS Current Concerns  Post-Acute Placement   Other Concerns:   none.    SOCIAL WORK ASSESSMENT / PLAN CSW attempted to meet with patient at bedside, hoever, patient presents asleep.CSW met with patient's daughter and granddaughter at bedside to discuss discharge disposition. Patient's family confirmed patient has pre-arranged for patient to be discharged to [facility] once medically stable for discharge. Patient's daughter hopeful patient will complete short-term rehabilitation and move home with patient's daughter.    CSW to continue to follow and assist with discharge planning needs.   Assessment/plan status:  Psychosocial Support/Ongoing Assessment of Needs Other assessment/ plan:   none.   Information/referral to community resources:   Patient to be discharged to [facility] once medically stable for discharge.    PATIENT'S/FAMILY'S RESPONSE TO PLAN OF CARE: Patient's family understanding and agreeable to CSW plan of care. Patient's family expressed no further questions or concerns at this time.       Lubertha Sayres, [place] ([phone]) Licensed Clinical Social Worker Orthopedics [phone]) and Surgical [phone])

## 2014-11-17 ENCOUNTER — Encounter: Payer: Self-pay | Admitting: Internal Medicine

## 2014-11-17 ENCOUNTER — Non-Acute Institutional Stay (SKILLED_NURSING_FACILITY): Payer: Commercial Managed Care - HMO | Admitting: Internal Medicine

## 2014-11-17 DIAGNOSIS — K5900 Constipation, unspecified: Secondary | ICD-10-CM

## 2014-11-17 DIAGNOSIS — N400 Enlarged prostate without lower urinary tract symptoms: Secondary | ICD-10-CM | POA: Diagnosis not present

## 2014-11-17 DIAGNOSIS — D62 Acute posthemorrhagic anemia: Secondary | ICD-10-CM | POA: Diagnosis not present

## 2014-11-17 DIAGNOSIS — E538 Deficiency of other specified B group vitamins: Secondary | ICD-10-CM | POA: Diagnosis not present

## 2014-11-17 DIAGNOSIS — M1712 Unilateral primary osteoarthritis, left knee: Secondary | ICD-10-CM

## 2014-11-17 DIAGNOSIS — I10 Essential (primary) hypertension: Secondary | ICD-10-CM | POA: Diagnosis not present

## 2014-11-17 NOTE — Progress Notes (Signed)
Patient ID: Alan Rios, male   DOB: 07-08-1919, 81 y.o.   MRN: [id number]     Facility: [facility]    PCP: No PCP Per Patient  Code Status: DNR  Allergies  Allergen Reactions  . Penicillins Hives    Chief Complaint  Patient presents with  . New Admit To SNF     HPI:  81 year old patient is here for short term rehabilitation post hospital admission from 11/14/2014- 11/16/2014 with left knee arthritis. He underwent left total knee arthroplasty. He is seen in his room today. He is alert but oriented only to person. He mentions that his pain is under control. Last bowel movement was 3 days back (patient thinks today is Tuesday and mentions having bowel movement on Saturday). Denies any concerns  Review of Systems:  Constitutional: Negative for fever, chills, diaphoresis.  HENT: Negative for headache, congestion, nasal discharge Eyes: Negative for eye pain, blurred vision, double vision and discharge.  Respiratory: Negative for cough, shortness of breath and wheezing.   Cardiovascular: Negative for chest pain, palpitations, leg swelling.  Gastrointestinal: Negative for heartburn, nausea, vomiting, abdominal pain Genitourinary: Negative for dysuria and flank pain.  Musculoskeletal: Negative for back pain, falls Skin: Negative for itching, rash.  Neurological: Negative fordizziness, tingling, focal weakness Psychiatric/Behavioral: Negative for depression   Past Medical History  Diagnosis Date  . Cancer of prostate   . Arthritis   . Joint swelling   . Joint pain    Past Surgical History  Procedure Laterality Date  . Cataract surgery Bilateral   . Total knee arthroplasty Left 11/14/2014    Procedure: TOTAL KNEE ARTHROPLASTY;  Surgeon: Kerin Salen, MD;  Location: [place];  Service: Orthopedics;  Laterality: Left;   Social History:   reports that he has never smoked. He does not have any smokeless tobacco history on file. He reports that he does  not drink alcohol or use illicit drugs.  History reviewed. No pertinent family history.  Medications: Patient's Medications  New Prescriptions   No medications on file  Previous Medications   ASPIRIN EC 325 MG TABLET    Take 1 tablet (325 mg total) by mouth 2 (two) times daily.   BETHANECHOL (URECHOLINE) 50 MG TABLET    Take 50 mg by mouth 2 (two) times daily with a meal.   CYANOCOBALAMIN (VITAMIN B12 PO)    Take 1 tablet by mouth every other day.   HYDROCHLOROTHIAZIDE (HYDRODIURIL) 12.5 MG TABLET    Take 12.5 mg by mouth daily.   HYDROCODONE-ACETAMINOPHEN (NORCO) 5-325 MG PER TABLET    Take 1 tablet by mouth every 6 (six) hours as needed.   TAMSULOSIN (FLOMAX) 0.4 MG CAPS CAPSULE    Take 0.4 mg by mouth 2 (two) times daily with a meal.    TIZANIDINE (ZANAFLEX) 2 MG CAPSULE    Take 1 capsule (2 mg total) by mouth 3 (three) times daily.  Modified Medications   No medications on file  Discontinued Medications   No medications on file     Physical Exam: Filed Vitals:   11/17/14 1354  BP: 133/74  Pulse: 88  Temp: 98 F (36.7 C)  Resp: 18  Height: 5\' 9"  (1.753 m)  Weight: 138 lb (62.596 kg)  SpO2: 93%    General- elderly male, well built, in no acute distress Head- normocephalic, atraumatic Throat- moist mucus membrane Eyes- PERRLA, EOMI, no pallor, no icterus, no discharge Neck- no cervical lymphadenopathy Cardiovascular- normal s1,s2, no  murmurs, palpable dorsalis pedis and radial pulses, trace left leg edema Respiratory- bilateral clear to auscultation, no wheeze, no rhonchi, no crackles, no use of accessory muscles Abdomen- bowel sounds present, soft, non tender Musculoskeletal- able to move all 4 extremities, limited range of motion at left knee Neurological- no focal deficit Skin- warm and dry, surgical incision in left knee with dressing in place and ace wrap Psychiatry- alert and oriented to person, normal mood and affect   Labs reviewed: Basic Metabolic  Panel:  Recent Labs  11/03/14 1345  NA 141  K 4.0  CL 105  CO2 31  GLUCOSE 81  BUN 14  CREATININE 1.19  CALCIUM 9.4   Liver Function Tests: No results for input(s): AST, ALT, ALKPHOS, BILITOT, PROT, ALBUMIN in the last 8760 hours. No results for input(s): LIPASE, AMYLASE in the last 8760 hours. No results for input(s): AMMONIA in the last 8760 hours. CBC:  Recent Labs  11/03/14 1345 11/15/14 0626 11/16/14 0541  WBC 4.5 8.4 9.9  HGB 14.9 14.0 12.7*  HCT 44.9 42.0 38.5*  MCV 98.2 99.8 98.7  PLT 155 134* 121*    Assessment/Plan  Left knee OA S/p left total knee arthroplasty. Will have him work with physical therapy and occupational therapy team to help with gait training and muscle strengthening exercises.fall precautions. Skin care. Encourage to be out of bed. Follow up with orthopedics in 2 weeks. Continue aspirin 325 mg bid for dvt prophylaxis, norco 5-325 q6prn for pain and zanaflex 2 mg tid for muscle spasm.  BPH On urecholine and flomax, continue this  HTN bp stable, continue hydrouridil 12.5 mg daily. Check bmp in 1 week  b12 deficiency Continue b12 1000 mcg daily  Constipation On colace 100 mg bid and miralax 17 g daily prn, change miralax to daily for now  Blood loss anemia Check h&h in 1 week  Goals of care: short term rehabilitation   Labs/tests ordered: cbc, bmp in 1 week  Family/ staff Communication: reviewed care plan with patient and nursing supervisor    Blanchie Serve, MD  [place] (Monday-Friday 8 am - 5 pm) [phone] (afterhours)

## 2014-11-24 LAB — BASIC METABOLIC PANEL
BUN: 17 mg/dL (ref 4–21)
Creatinine: 1 mg/dL (ref 0.6–1.3)
GLUCOSE: 89 mg/dL
Potassium: 4 mmol/L (ref 3.4–5.3)
Sodium: 140 mmol/L (ref 137–147)

## 2014-11-24 LAB — CBC AND DIFFERENTIAL
HCT: 34 % — AB (ref 41–53)
HEMOGLOBIN: 11.3 g/dL — AB (ref 13.5–17.5)
Platelets: 338 10*3/uL (ref 150–399)
WBC: 6.1 10*3/mL

## 2014-11-29 DIAGNOSIS — Z471 Aftercare following joint replacement surgery: Secondary | ICD-10-CM | POA: Diagnosis not present

## 2014-11-29 DIAGNOSIS — Z96652 Presence of left artificial knee joint: Secondary | ICD-10-CM | POA: Diagnosis not present

## 2014-12-05 ENCOUNTER — Non-Acute Institutional Stay (SKILLED_NURSING_FACILITY): Payer: Commercial Managed Care - HMO | Admitting: Registered Nurse

## 2014-12-05 ENCOUNTER — Encounter: Payer: Self-pay | Admitting: Registered Nurse

## 2014-12-05 DIAGNOSIS — E538 Deficiency of other specified B group vitamins: Secondary | ICD-10-CM | POA: Diagnosis not present

## 2014-12-05 DIAGNOSIS — M129 Arthropathy, unspecified: Secondary | ICD-10-CM | POA: Diagnosis not present

## 2014-12-05 DIAGNOSIS — M1712 Unilateral primary osteoarthritis, left knee: Secondary | ICD-10-CM

## 2014-12-05 DIAGNOSIS — D62 Acute posthemorrhagic anemia: Secondary | ICD-10-CM | POA: Diagnosis not present

## 2014-12-05 DIAGNOSIS — I10 Essential (primary) hypertension: Secondary | ICD-10-CM | POA: Diagnosis not present

## 2014-12-05 DIAGNOSIS — N400 Enlarged prostate without lower urinary tract symptoms: Secondary | ICD-10-CM

## 2014-12-05 DIAGNOSIS — K5900 Constipation, unspecified: Secondary | ICD-10-CM | POA: Diagnosis not present

## 2014-12-05 NOTE — Progress Notes (Signed)
Patient ID: Alan Rios, male   DOB: 01/06/1919, 81 y.o.   MRN: [id number]   Place of Service: [facility]  Allergies  Allergen Reactions  . Penicillins Hives    Code Status: DNR  Goals of Care: Comfort and Quality of Life/STR  Chief Complaint  Patient presents with  . Discharge Note    HPI 81 y.o. male with PMH of OA, prostate cancer, BPH among others is being seen for a discharge visit. Patient was here for short-term rehabilitation post hospital admission from 11/14/14 to 11/16/14 for total left knee arthroplasty. Patient has worked with therapy team and is ready to be discharged home with [facility] PT/OT. Seen in room today. Denies any concerns. Reported pain is adequately controlled with current regimen. No issues with constipation.    Review of Systems Constitutional: Negative for fever and chills. HENT: Negative for ear pain, congestion, and sore throat Eyes: Negative for eye pain and eye discharge  Cardiovascular: Negative for chest pain, palpitations, and leg swelling Respiratory: Negative cough, shortness of breath, and wheezing.  Gastrointestinal: Negative for nausea and vomiting. Negative for abdominal pain, diarrhea and constipation.  Genitourinary: Negative for  dysuria Musculoskeletal: Negative for back pain. Positive for left knee pain Neurological: Negative for dizziness and headache.  Skin: Negative for rash.   Psychiatric: Negative for depression  Past Medical History  Diagnosis Date  . Cancer of prostate   . Arthritis   . Joint swelling   . Joint pain     Past Surgical History  Procedure Laterality Date  . Cataract surgery Bilateral   . Total knee arthroplasty Left 11/14/2014    Procedure: TOTAL KNEE ARTHROPLASTY;  Surgeon: Kerin Salen, MD;  Location: [place];  Service: Orthopedics;  Laterality: Left;    History   Social History  . Marital Status: Married    Spouse Name: N/A  . Number of Children: N/A  . Years of Education: N/A    Occupational History  . Not on file.   Social History Main Topics  . Smoking status: Never Smoker   . Smokeless tobacco: Not on file  . Alcohol Use: No  . Drug Use: No  . Sexual Activity: No   Other Topics Concern  . Not on file   Social History Narrative    No pertinent family hx     Medication List       This list is accurate as of: 12/05/14  4:53 PM.  Always use your most recent med list.               aspirin EC 325 MG tablet  Take 1 tablet (325 mg total) by mouth 2 (two) times daily.     bethanechol 50 MG tablet  Commonly known as:  URECHOLINE  Take 50 mg by mouth 2 (two) times daily with a meal.     hydrochlorothiazide 12.5 MG tablet  Commonly known as:  HYDRODIURIL  Take 12.5 mg by mouth daily.     HYDROcodone-acetaminophen 5-325 MG per tablet  Commonly known as:  NORCO  Take 1 tablet by mouth every 6 (six) hours as needed.     tamsulosin 0.4 MG Caps capsule  Commonly known as:  FLOMAX  Take 0.4 mg by mouth 2 (two) times daily with a meal.     tizanidine 2 MG capsule  Commonly known as:  ZANAFLEX  Take 1 capsule (2 mg total) by mouth 3 (three) times daily.     VITAMIN B12 PO  Take 1 tablet by mouth every other day.        Physical Exam  BP 120/60 mmHg  Pulse 81  Temp(Src) 97.8 F (36.6 C)  Resp 20  Ht 5\' 9"  (1.753 m)  Wt 134 lb 12.8 oz (61.145 kg)  BMI 19.90 kg/m2  Constitutional: thin/frail elderly male in no acute distress. Conversant and pleasant HEENT: Normocephalic and atraumatic. PERRL. EOM intact. No scleral icterus. No nasal discharge or sinus tenderness. Oral mucosa moist. Posterior pharynx clear of any exudate or lesions.  Neck: Supple and nontender. No lymphadenopathy, masses, or thyromegaly. No JVD or carotid bruits. Cardiac: Normal S1, S2. RRR without appreciable murmurs, rubs, or gallops. Distal pulses intact. Trace dependent edema. BLE TED hose in place Lungs: No respiratory distress. Breath sounds clear bilaterally  without rales, rhonchi, or wheezes. Abdomen: Audible bowel sounds in all quadrants. Soft, nontender, nondistended. No palpable mass.  Musculoskeletal: able to move all extremities. Left knee slightly edematous and tender to palpation. Surgical incision w/o signs of infections.  Skin: Warm and dry. No rash noted. No erythema.  Neurological: Alert and oriented to self Psychiatric: Appropriate mood and affect.    Labs Reviewed  CBC Latest Ref Rng 11/24/2014 11/16/2014 11/15/2014  WBC - 6.1 9.9 8.4  Hemoglobin 13.5 - 17.5 g/dL 11.3(A) 12.7(L) 14.0  Hematocrit 41 - 53 % 34(A) 38.5(L) 42.0  Platelets 150 - 399 K/L 338 121(L) 134(L)    CMP Latest Ref Rng 11/24/2014 11/03/2014  Glucose 70 - 99 mg/dL - 81  BUN 4 - 21 mg/dL 17 14  Creatinine 0.6 - 1.3 mg/dL 1.0 1.19  Sodium 137 - 147 mmol/L 140 141  Potassium 3.4 - 5.3 mmol/L 4.0 4.0  Chloride 96 - 112 mmol/L - 105  CO2 19 - 32 mmol/L - 31  Calcium 8.4 - 10.5 mg/dL - 9.4    Assessment & Plan 1. Arthritis of left knee S/p Left TKA. Continue norco 5/325mg  every six hours as needed for pain with zanaflex 2mg  three times daily for muscle spasms. Continue ASA 325mg  two times daily for DVT prophylaxis. Continue to work with [facility] PT/O for gait/strength/balance training to restore/maximize function. Continue F/u with ortho.  2. Constipation, unspecified constipation type Stable. Continue colace and miralax as needed for consitpation. Encourage hydration and ambulation as tolerated.   3. Acute blood loss anemia Mostly like post op. Recent h&h stable 11.3/34.2.   4. Essential hypertension Stabl.e continue hctz 12.5mg  daily.   5. BPH (benign prostatic hyperplasia) Stable. Continue flomax 0.4mg  twice daily with urecholine 50mg  2 times daily.   6. Vitamin B12 deficiency Continue B12 supplement.   Home health services:PT/OT DME required:None PCP follow-up: Continue to f/u with Dr. Mayer Camel, ortho. Encourage to establish care with a PCP  w/in 1-2 weeks  of discharge from SNF or asap.  30-day supply of prescription medications provided (#30 Norco  5/325mg )  Family/Staff Communication Plan of care discussed with patient and nursing staff. Patient and nursing staff verbalized understanding and agree with plan of care. No additional questions or concerns reported.    Arthur Holms, MSN, AGNP-C [facility] [address] [place], [place] [address] [phone] [8am-5pm] After hours: [phone]

## 2014-12-07 DIAGNOSIS — I10 Essential (primary) hypertension: Secondary | ICD-10-CM | POA: Diagnosis not present

## 2014-12-07 DIAGNOSIS — Z96652 Presence of left artificial knee joint: Secondary | ICD-10-CM | POA: Diagnosis not present

## 2014-12-07 DIAGNOSIS — M1990 Unspecified osteoarthritis, unspecified site: Secondary | ICD-10-CM | POA: Diagnosis not present

## 2014-12-07 DIAGNOSIS — Z471 Aftercare following joint replacement surgery: Secondary | ICD-10-CM | POA: Diagnosis not present

## 2014-12-07 DIAGNOSIS — Z9181 History of falling: Secondary | ICD-10-CM | POA: Diagnosis not present

## 2014-12-08 DIAGNOSIS — Z471 Aftercare following joint replacement surgery: Secondary | ICD-10-CM | POA: Diagnosis not present

## 2014-12-08 DIAGNOSIS — Z96652 Presence of left artificial knee joint: Secondary | ICD-10-CM | POA: Diagnosis not present

## 2014-12-08 DIAGNOSIS — I10 Essential (primary) hypertension: Secondary | ICD-10-CM | POA: Diagnosis not present

## 2014-12-08 DIAGNOSIS — M1990 Unspecified osteoarthritis, unspecified site: Secondary | ICD-10-CM | POA: Diagnosis not present

## 2014-12-08 DIAGNOSIS — Z9181 History of falling: Secondary | ICD-10-CM | POA: Diagnosis not present

## 2014-12-09 DIAGNOSIS — Z96652 Presence of left artificial knee joint: Secondary | ICD-10-CM | POA: Diagnosis not present

## 2014-12-09 DIAGNOSIS — Z471 Aftercare following joint replacement surgery: Secondary | ICD-10-CM | POA: Diagnosis not present

## 2014-12-09 DIAGNOSIS — I10 Essential (primary) hypertension: Secondary | ICD-10-CM | POA: Diagnosis not present

## 2014-12-09 DIAGNOSIS — Z9181 History of falling: Secondary | ICD-10-CM | POA: Diagnosis not present

## 2014-12-09 DIAGNOSIS — M1990 Unspecified osteoarthritis, unspecified site: Secondary | ICD-10-CM | POA: Diagnosis not present

## 2014-12-12 DIAGNOSIS — Z471 Aftercare following joint replacement surgery: Secondary | ICD-10-CM | POA: Diagnosis not present

## 2014-12-12 DIAGNOSIS — Z96652 Presence of left artificial knee joint: Secondary | ICD-10-CM | POA: Diagnosis not present

## 2014-12-12 DIAGNOSIS — M1990 Unspecified osteoarthritis, unspecified site: Secondary | ICD-10-CM | POA: Diagnosis not present

## 2014-12-12 DIAGNOSIS — I10 Essential (primary) hypertension: Secondary | ICD-10-CM | POA: Diagnosis not present

## 2014-12-12 DIAGNOSIS — Z9181 History of falling: Secondary | ICD-10-CM | POA: Diagnosis not present

## 2014-12-14 DIAGNOSIS — Z471 Aftercare following joint replacement surgery: Secondary | ICD-10-CM | POA: Diagnosis not present

## 2014-12-14 DIAGNOSIS — Z96652 Presence of left artificial knee joint: Secondary | ICD-10-CM | POA: Diagnosis not present

## 2014-12-14 DIAGNOSIS — M1990 Unspecified osteoarthritis, unspecified site: Secondary | ICD-10-CM | POA: Diagnosis not present

## 2014-12-14 DIAGNOSIS — I10 Essential (primary) hypertension: Secondary | ICD-10-CM | POA: Diagnosis not present

## 2014-12-14 DIAGNOSIS — Z9181 History of falling: Secondary | ICD-10-CM | POA: Diagnosis not present

## 2014-12-15 DIAGNOSIS — Z471 Aftercare following joint replacement surgery: Secondary | ICD-10-CM | POA: Diagnosis not present

## 2014-12-15 DIAGNOSIS — Z9181 History of falling: Secondary | ICD-10-CM | POA: Diagnosis not present

## 2014-12-15 DIAGNOSIS — Z96652 Presence of left artificial knee joint: Secondary | ICD-10-CM | POA: Diagnosis not present

## 2014-12-15 DIAGNOSIS — M1990 Unspecified osteoarthritis, unspecified site: Secondary | ICD-10-CM | POA: Diagnosis not present

## 2014-12-15 DIAGNOSIS — I10 Essential (primary) hypertension: Secondary | ICD-10-CM | POA: Diagnosis not present

## 2014-12-19 DIAGNOSIS — Z9181 History of falling: Secondary | ICD-10-CM | POA: Diagnosis not present

## 2014-12-19 DIAGNOSIS — M1990 Unspecified osteoarthritis, unspecified site: Secondary | ICD-10-CM | POA: Diagnosis not present

## 2014-12-19 DIAGNOSIS — Z471 Aftercare following joint replacement surgery: Secondary | ICD-10-CM | POA: Diagnosis not present

## 2014-12-19 DIAGNOSIS — I10 Essential (primary) hypertension: Secondary | ICD-10-CM | POA: Diagnosis not present

## 2014-12-19 DIAGNOSIS — Z96652 Presence of left artificial knee joint: Secondary | ICD-10-CM | POA: Diagnosis not present

## 2014-12-20 DIAGNOSIS — M1990 Unspecified osteoarthritis, unspecified site: Secondary | ICD-10-CM | POA: Diagnosis not present

## 2014-12-20 DIAGNOSIS — Z9181 History of falling: Secondary | ICD-10-CM | POA: Diagnosis not present

## 2014-12-20 DIAGNOSIS — Z96652 Presence of left artificial knee joint: Secondary | ICD-10-CM | POA: Diagnosis not present

## 2014-12-20 DIAGNOSIS — Z471 Aftercare following joint replacement surgery: Secondary | ICD-10-CM | POA: Diagnosis not present

## 2014-12-20 DIAGNOSIS — I10 Essential (primary) hypertension: Secondary | ICD-10-CM | POA: Diagnosis not present

## 2014-12-21 DIAGNOSIS — M1990 Unspecified osteoarthritis, unspecified site: Secondary | ICD-10-CM | POA: Diagnosis not present

## 2014-12-21 DIAGNOSIS — Z9181 History of falling: Secondary | ICD-10-CM | POA: Diagnosis not present

## 2014-12-21 DIAGNOSIS — Z471 Aftercare following joint replacement surgery: Secondary | ICD-10-CM | POA: Diagnosis not present

## 2014-12-21 DIAGNOSIS — Z96652 Presence of left artificial knee joint: Secondary | ICD-10-CM | POA: Diagnosis not present

## 2014-12-21 DIAGNOSIS — I10 Essential (primary) hypertension: Secondary | ICD-10-CM | POA: Diagnosis not present

## 2014-12-22 DIAGNOSIS — Z9181 History of falling: Secondary | ICD-10-CM | POA: Diagnosis not present

## 2014-12-22 DIAGNOSIS — Z96652 Presence of left artificial knee joint: Secondary | ICD-10-CM | POA: Diagnosis not present

## 2014-12-22 DIAGNOSIS — M179 Osteoarthritis of knee, unspecified: Secondary | ICD-10-CM | POA: Diagnosis not present

## 2014-12-22 DIAGNOSIS — Z6820 Body mass index (BMI) 20.0-20.9, adult: Secondary | ICD-10-CM | POA: Diagnosis not present

## 2014-12-22 DIAGNOSIS — Z1389 Encounter for screening for other disorder: Secondary | ICD-10-CM | POA: Diagnosis not present

## 2014-12-23 DIAGNOSIS — Z96652 Presence of left artificial knee joint: Secondary | ICD-10-CM | POA: Diagnosis not present

## 2014-12-23 DIAGNOSIS — I10 Essential (primary) hypertension: Secondary | ICD-10-CM | POA: Diagnosis not present

## 2014-12-23 DIAGNOSIS — M1990 Unspecified osteoarthritis, unspecified site: Secondary | ICD-10-CM | POA: Diagnosis not present

## 2014-12-23 DIAGNOSIS — Z471 Aftercare following joint replacement surgery: Secondary | ICD-10-CM | POA: Diagnosis not present

## 2014-12-23 DIAGNOSIS — Z9181 History of falling: Secondary | ICD-10-CM | POA: Diagnosis not present

## 2014-12-27 DIAGNOSIS — Z9181 History of falling: Secondary | ICD-10-CM | POA: Diagnosis not present

## 2014-12-27 DIAGNOSIS — I10 Essential (primary) hypertension: Secondary | ICD-10-CM | POA: Diagnosis not present

## 2014-12-27 DIAGNOSIS — M1990 Unspecified osteoarthritis, unspecified site: Secondary | ICD-10-CM | POA: Diagnosis not present

## 2014-12-27 DIAGNOSIS — Z96652 Presence of left artificial knee joint: Secondary | ICD-10-CM | POA: Diagnosis not present

## 2014-12-27 DIAGNOSIS — Z471 Aftercare following joint replacement surgery: Secondary | ICD-10-CM | POA: Diagnosis not present

## 2014-12-28 DIAGNOSIS — Z9181 History of falling: Secondary | ICD-10-CM | POA: Diagnosis not present

## 2014-12-28 DIAGNOSIS — Z471 Aftercare following joint replacement surgery: Secondary | ICD-10-CM | POA: Diagnosis not present

## 2014-12-28 DIAGNOSIS — I10 Essential (primary) hypertension: Secondary | ICD-10-CM | POA: Diagnosis not present

## 2014-12-28 DIAGNOSIS — M1990 Unspecified osteoarthritis, unspecified site: Secondary | ICD-10-CM | POA: Diagnosis not present

## 2014-12-28 DIAGNOSIS — Z96652 Presence of left artificial knee joint: Secondary | ICD-10-CM | POA: Diagnosis not present

## 2014-12-29 DIAGNOSIS — Z96652 Presence of left artificial knee joint: Secondary | ICD-10-CM | POA: Diagnosis not present

## 2014-12-29 DIAGNOSIS — Z471 Aftercare following joint replacement surgery: Secondary | ICD-10-CM | POA: Diagnosis not present

## 2014-12-29 DIAGNOSIS — M1990 Unspecified osteoarthritis, unspecified site: Secondary | ICD-10-CM | POA: Diagnosis not present

## 2014-12-29 DIAGNOSIS — Z9181 History of falling: Secondary | ICD-10-CM | POA: Diagnosis not present

## 2014-12-29 DIAGNOSIS — I10 Essential (primary) hypertension: Secondary | ICD-10-CM | POA: Diagnosis not present

## 2015-01-03 DIAGNOSIS — I10 Essential (primary) hypertension: Secondary | ICD-10-CM | POA: Diagnosis not present

## 2015-01-03 DIAGNOSIS — Z9889 Other specified postprocedural states: Secondary | ICD-10-CM | POA: Diagnosis not present

## 2015-01-03 DIAGNOSIS — Z9181 History of falling: Secondary | ICD-10-CM | POA: Diagnosis not present

## 2015-01-03 DIAGNOSIS — Z471 Aftercare following joint replacement surgery: Secondary | ICD-10-CM | POA: Diagnosis not present

## 2015-01-03 DIAGNOSIS — Z96652 Presence of left artificial knee joint: Secondary | ICD-10-CM | POA: Diagnosis not present

## 2015-01-03 DIAGNOSIS — M1990 Unspecified osteoarthritis, unspecified site: Secondary | ICD-10-CM | POA: Diagnosis not present

## 2015-01-06 DIAGNOSIS — Z96652 Presence of left artificial knee joint: Secondary | ICD-10-CM | POA: Diagnosis not present

## 2015-01-06 DIAGNOSIS — Z471 Aftercare following joint replacement surgery: Secondary | ICD-10-CM | POA: Diagnosis not present

## 2015-01-06 DIAGNOSIS — I10 Essential (primary) hypertension: Secondary | ICD-10-CM | POA: Diagnosis not present

## 2015-01-06 DIAGNOSIS — Z9181 History of falling: Secondary | ICD-10-CM | POA: Diagnosis not present

## 2015-01-06 DIAGNOSIS — M1990 Unspecified osteoarthritis, unspecified site: Secondary | ICD-10-CM | POA: Diagnosis not present

## 2015-01-09 DIAGNOSIS — Z9181 History of falling: Secondary | ICD-10-CM | POA: Diagnosis not present

## 2015-01-09 DIAGNOSIS — Z96652 Presence of left artificial knee joint: Secondary | ICD-10-CM | POA: Diagnosis not present

## 2015-01-09 DIAGNOSIS — Z471 Aftercare following joint replacement surgery: Secondary | ICD-10-CM | POA: Diagnosis not present

## 2015-01-09 DIAGNOSIS — M1990 Unspecified osteoarthritis, unspecified site: Secondary | ICD-10-CM | POA: Diagnosis not present

## 2015-01-09 DIAGNOSIS — I10 Essential (primary) hypertension: Secondary | ICD-10-CM | POA: Diagnosis not present

## 2015-01-11 DIAGNOSIS — Z471 Aftercare following joint replacement surgery: Secondary | ICD-10-CM | POA: Diagnosis not present

## 2015-01-11 DIAGNOSIS — I10 Essential (primary) hypertension: Secondary | ICD-10-CM | POA: Diagnosis not present

## 2015-01-11 DIAGNOSIS — M1990 Unspecified osteoarthritis, unspecified site: Secondary | ICD-10-CM | POA: Diagnosis not present

## 2015-01-11 DIAGNOSIS — Z96652 Presence of left artificial knee joint: Secondary | ICD-10-CM | POA: Diagnosis not present

## 2015-01-11 DIAGNOSIS — Z9181 History of falling: Secondary | ICD-10-CM | POA: Diagnosis not present

## 2015-01-19 DIAGNOSIS — Z96652 Presence of left artificial knee joint: Secondary | ICD-10-CM | POA: Diagnosis not present

## 2015-01-19 DIAGNOSIS — Z471 Aftercare following joint replacement surgery: Secondary | ICD-10-CM | POA: Diagnosis not present

## 2015-01-24 DIAGNOSIS — Z471 Aftercare following joint replacement surgery: Secondary | ICD-10-CM | POA: Diagnosis not present

## 2015-01-24 DIAGNOSIS — Z96652 Presence of left artificial knee joint: Secondary | ICD-10-CM | POA: Diagnosis not present

## 2015-01-26 DIAGNOSIS — Z471 Aftercare following joint replacement surgery: Secondary | ICD-10-CM | POA: Diagnosis not present

## 2015-01-26 DIAGNOSIS — Z96652 Presence of left artificial knee joint: Secondary | ICD-10-CM | POA: Diagnosis not present

## 2015-01-29 ENCOUNTER — Other Ambulatory Visit: Payer: Self-pay | Admitting: Internal Medicine

## 2015-01-31 DIAGNOSIS — Z96652 Presence of left artificial knee joint: Secondary | ICD-10-CM | POA: Diagnosis not present

## 2015-01-31 DIAGNOSIS — Z471 Aftercare following joint replacement surgery: Secondary | ICD-10-CM | POA: Diagnosis not present

## 2015-02-02 DIAGNOSIS — Z96652 Presence of left artificial knee joint: Secondary | ICD-10-CM | POA: Diagnosis not present

## 2015-02-02 DIAGNOSIS — Z471 Aftercare following joint replacement surgery: Secondary | ICD-10-CM | POA: Diagnosis not present

## 2015-02-07 DIAGNOSIS — Z96652 Presence of left artificial knee joint: Secondary | ICD-10-CM | POA: Diagnosis not present

## 2015-02-07 DIAGNOSIS — Z471 Aftercare following joint replacement surgery: Secondary | ICD-10-CM | POA: Diagnosis not present

## 2015-02-09 DIAGNOSIS — Z471 Aftercare following joint replacement surgery: Secondary | ICD-10-CM | POA: Diagnosis not present

## 2015-02-09 DIAGNOSIS — Z96652 Presence of left artificial knee joint: Secondary | ICD-10-CM | POA: Diagnosis not present

## 2015-02-13 DIAGNOSIS — L853 Xerosis cutis: Secondary | ICD-10-CM | POA: Diagnosis not present

## 2015-02-13 DIAGNOSIS — Z1389 Encounter for screening for other disorder: Secondary | ICD-10-CM | POA: Diagnosis not present

## 2015-02-13 DIAGNOSIS — Z9181 History of falling: Secondary | ICD-10-CM | POA: Diagnosis not present

## 2015-02-13 DIAGNOSIS — Z6820 Body mass index (BMI) 20.0-20.9, adult: Secondary | ICD-10-CM | POA: Diagnosis not present

## 2015-02-14 DIAGNOSIS — Z96652 Presence of left artificial knee joint: Secondary | ICD-10-CM | POA: Diagnosis not present

## 2015-02-14 DIAGNOSIS — Z471 Aftercare following joint replacement surgery: Secondary | ICD-10-CM | POA: Diagnosis not present

## 2015-02-16 DIAGNOSIS — Z96652 Presence of left artificial knee joint: Secondary | ICD-10-CM | POA: Diagnosis not present

## 2015-02-16 DIAGNOSIS — Z471 Aftercare following joint replacement surgery: Secondary | ICD-10-CM | POA: Diagnosis not present

## 2015-02-22 DIAGNOSIS — Z96652 Presence of left artificial knee joint: Secondary | ICD-10-CM | POA: Diagnosis not present

## 2015-02-22 DIAGNOSIS — Z471 Aftercare following joint replacement surgery: Secondary | ICD-10-CM | POA: Diagnosis not present

## 2015-02-24 DIAGNOSIS — Z96652 Presence of left artificial knee joint: Secondary | ICD-10-CM | POA: Diagnosis not present

## 2015-02-24 DIAGNOSIS — Z471 Aftercare following joint replacement surgery: Secondary | ICD-10-CM | POA: Diagnosis not present

## 2015-02-28 ENCOUNTER — Other Ambulatory Visit: Payer: Self-pay | Admitting: Internal Medicine

## 2015-02-28 DIAGNOSIS — Z471 Aftercare following joint replacement surgery: Secondary | ICD-10-CM | POA: Diagnosis not present

## 2015-02-28 DIAGNOSIS — Z96652 Presence of left artificial knee joint: Secondary | ICD-10-CM | POA: Diagnosis not present

## 2015-03-02 DIAGNOSIS — Z96652 Presence of left artificial knee joint: Secondary | ICD-10-CM | POA: Diagnosis not present

## 2015-03-02 DIAGNOSIS — Z471 Aftercare following joint replacement surgery: Secondary | ICD-10-CM | POA: Diagnosis not present

## 2015-03-07 DIAGNOSIS — Z471 Aftercare following joint replacement surgery: Secondary | ICD-10-CM | POA: Diagnosis not present

## 2015-03-07 DIAGNOSIS — Z96652 Presence of left artificial knee joint: Secondary | ICD-10-CM | POA: Diagnosis not present

## 2015-03-08 ENCOUNTER — Other Ambulatory Visit: Payer: Self-pay | Admitting: Internal Medicine

## 2015-03-09 DIAGNOSIS — Z471 Aftercare following joint replacement surgery: Secondary | ICD-10-CM | POA: Diagnosis not present

## 2015-03-09 DIAGNOSIS — Z96652 Presence of left artificial knee joint: Secondary | ICD-10-CM | POA: Diagnosis not present

## 2015-03-14 DIAGNOSIS — Z471 Aftercare following joint replacement surgery: Secondary | ICD-10-CM | POA: Diagnosis not present

## 2015-03-14 DIAGNOSIS — Z96652 Presence of left artificial knee joint: Secondary | ICD-10-CM | POA: Diagnosis not present

## 2015-03-16 DIAGNOSIS — Z96652 Presence of left artificial knee joint: Secondary | ICD-10-CM | POA: Diagnosis not present

## 2015-03-16 DIAGNOSIS — Z471 Aftercare following joint replacement surgery: Secondary | ICD-10-CM | POA: Diagnosis not present

## 2015-03-21 DIAGNOSIS — Z96652 Presence of left artificial knee joint: Secondary | ICD-10-CM | POA: Diagnosis not present

## 2015-03-21 DIAGNOSIS — Z09 Encounter for follow-up examination after completed treatment for conditions other than malignant neoplasm: Secondary | ICD-10-CM | POA: Diagnosis not present

## 2015-04-01 ENCOUNTER — Other Ambulatory Visit: Payer: Self-pay | Admitting: Internal Medicine

## 2015-04-06 DIAGNOSIS — Z471 Aftercare following joint replacement surgery: Secondary | ICD-10-CM | POA: Diagnosis not present

## 2015-04-06 DIAGNOSIS — Z96652 Presence of left artificial knee joint: Secondary | ICD-10-CM | POA: Diagnosis not present

## 2015-04-19 DIAGNOSIS — Z471 Aftercare following joint replacement surgery: Secondary | ICD-10-CM | POA: Diagnosis not present

## 2015-04-19 DIAGNOSIS — Z96652 Presence of left artificial knee joint: Secondary | ICD-10-CM | POA: Diagnosis not present

## 2015-04-21 DIAGNOSIS — Z471 Aftercare following joint replacement surgery: Secondary | ICD-10-CM | POA: Diagnosis not present

## 2015-04-21 DIAGNOSIS — Z96652 Presence of left artificial knee joint: Secondary | ICD-10-CM | POA: Diagnosis not present

## 2015-04-26 DIAGNOSIS — Z96652 Presence of left artificial knee joint: Secondary | ICD-10-CM | POA: Diagnosis not present

## 2015-04-26 DIAGNOSIS — Z471 Aftercare following joint replacement surgery: Secondary | ICD-10-CM | POA: Diagnosis not present

## 2015-04-28 DIAGNOSIS — Z471 Aftercare following joint replacement surgery: Secondary | ICD-10-CM | POA: Diagnosis not present

## 2015-04-28 DIAGNOSIS — Z96652 Presence of left artificial knee joint: Secondary | ICD-10-CM | POA: Diagnosis not present

## 2015-05-03 DIAGNOSIS — R351 Nocturia: Secondary | ICD-10-CM | POA: Diagnosis not present

## 2015-05-03 DIAGNOSIS — R339 Retention of urine, unspecified: Secondary | ICD-10-CM | POA: Diagnosis not present

## 2015-05-03 DIAGNOSIS — C61 Malignant neoplasm of prostate: Secondary | ICD-10-CM | POA: Diagnosis not present

## 2015-05-05 DIAGNOSIS — Z96652 Presence of left artificial knee joint: Secondary | ICD-10-CM | POA: Diagnosis not present

## 2015-05-05 DIAGNOSIS — Z471 Aftercare following joint replacement surgery: Secondary | ICD-10-CM | POA: Diagnosis not present

## 2015-05-10 DIAGNOSIS — Z471 Aftercare following joint replacement surgery: Secondary | ICD-10-CM | POA: Diagnosis not present

## 2015-05-10 DIAGNOSIS — Z96652 Presence of left artificial knee joint: Secondary | ICD-10-CM | POA: Diagnosis not present

## 2015-05-17 DIAGNOSIS — Z471 Aftercare following joint replacement surgery: Secondary | ICD-10-CM | POA: Diagnosis not present

## 2015-05-17 DIAGNOSIS — Z96652 Presence of left artificial knee joint: Secondary | ICD-10-CM | POA: Diagnosis not present

## 2015-05-26 DIAGNOSIS — R031 Nonspecific low blood-pressure reading: Secondary | ICD-10-CM | POA: Diagnosis not present

## 2015-05-26 DIAGNOSIS — Z6821 Body mass index (BMI) 21.0-21.9, adult: Secondary | ICD-10-CM | POA: Diagnosis not present

## 2015-07-12 DIAGNOSIS — Z6821 Body mass index (BMI) 21.0-21.9, adult: Secondary | ICD-10-CM | POA: Diagnosis not present

## 2015-07-12 DIAGNOSIS — M1711 Unilateral primary osteoarthritis, right knee: Secondary | ICD-10-CM | POA: Diagnosis not present

## 2015-07-12 DIAGNOSIS — Z1389 Encounter for screening for other disorder: Secondary | ICD-10-CM | POA: Diagnosis not present

## 2015-07-12 DIAGNOSIS — Z09 Encounter for follow-up examination after completed treatment for conditions other than malignant neoplasm: Secondary | ICD-10-CM | POA: Diagnosis not present

## 2015-07-12 DIAGNOSIS — M25561 Pain in right knee: Secondary | ICD-10-CM | POA: Diagnosis not present

## 2015-07-12 DIAGNOSIS — Z9181 History of falling: Secondary | ICD-10-CM | POA: Diagnosis not present

## 2015-07-12 DIAGNOSIS — I10 Essential (primary) hypertension: Secondary | ICD-10-CM | POA: Diagnosis not present

## 2015-07-12 DIAGNOSIS — Z96652 Presence of left artificial knee joint: Secondary | ICD-10-CM | POA: Diagnosis not present

## 2015-10-06 DIAGNOSIS — Z6822 Body mass index (BMI) 22.0-22.9, adult: Secondary | ICD-10-CM | POA: Diagnosis not present

## 2015-10-06 DIAGNOSIS — I4891 Unspecified atrial fibrillation: Secondary | ICD-10-CM | POA: Diagnosis not present

## 2015-10-06 DIAGNOSIS — M65342 Trigger finger, left ring finger: Secondary | ICD-10-CM | POA: Diagnosis not present

## 2015-10-20 DIAGNOSIS — I4891 Unspecified atrial fibrillation: Secondary | ICD-10-CM | POA: Diagnosis not present

## 2015-10-20 DIAGNOSIS — Z6822 Body mass index (BMI) 22.0-22.9, adult: Secondary | ICD-10-CM | POA: Diagnosis not present

## 2015-11-03 DIAGNOSIS — C61 Malignant neoplasm of prostate: Secondary | ICD-10-CM | POA: Diagnosis not present

## 2015-11-03 DIAGNOSIS — R339 Retention of urine, unspecified: Secondary | ICD-10-CM | POA: Diagnosis not present

## 2015-12-05 DIAGNOSIS — J029 Acute pharyngitis, unspecified: Secondary | ICD-10-CM | POA: Diagnosis not present

## 2015-12-05 DIAGNOSIS — R51 Headache: Secondary | ICD-10-CM | POA: Diagnosis not present

## 2015-12-05 DIAGNOSIS — J3489 Other specified disorders of nose and nasal sinuses: Secondary | ICD-10-CM | POA: Diagnosis not present

## 2015-12-05 DIAGNOSIS — R1110 Vomiting, unspecified: Secondary | ICD-10-CM | POA: Diagnosis not present

## 2016-01-09 DIAGNOSIS — M1711 Unilateral primary osteoarthritis, right knee: Secondary | ICD-10-CM | POA: Diagnosis not present

## 2016-01-19 DIAGNOSIS — Z79899 Other long term (current) drug therapy: Secondary | ICD-10-CM | POA: Diagnosis not present

## 2016-01-19 DIAGNOSIS — Z6822 Body mass index (BMI) 22.0-22.9, adult: Secondary | ICD-10-CM | POA: Diagnosis not present

## 2016-01-19 DIAGNOSIS — Z139 Encounter for screening, unspecified: Secondary | ICD-10-CM | POA: Diagnosis not present

## 2016-01-19 DIAGNOSIS — I4891 Unspecified atrial fibrillation: Secondary | ICD-10-CM | POA: Diagnosis not present

## 2016-01-19 DIAGNOSIS — I959 Hypotension, unspecified: Secondary | ICD-10-CM | POA: Diagnosis not present

## 2016-02-09 DIAGNOSIS — Z6821 Body mass index (BMI) 21.0-21.9, adult: Secondary | ICD-10-CM | POA: Diagnosis not present

## 2016-02-09 DIAGNOSIS — R200 Anesthesia of skin: Secondary | ICD-10-CM | POA: Diagnosis not present

## 2016-02-09 DIAGNOSIS — R51 Headache: Secondary | ICD-10-CM | POA: Diagnosis not present

## 2016-02-09 DIAGNOSIS — I4891 Unspecified atrial fibrillation: Secondary | ICD-10-CM | POA: Diagnosis not present

## 2016-02-20 DIAGNOSIS — R200 Anesthesia of skin: Secondary | ICD-10-CM | POA: Diagnosis not present

## 2016-02-20 DIAGNOSIS — R51 Headache: Secondary | ICD-10-CM | POA: Diagnosis not present

## 2016-04-25 DIAGNOSIS — S29019A Strain of muscle and tendon of unspecified wall of thorax, initial encounter: Secondary | ICD-10-CM | POA: Diagnosis not present

## 2016-04-25 DIAGNOSIS — Z6821 Body mass index (BMI) 21.0-21.9, adult: Secondary | ICD-10-CM | POA: Diagnosis not present

## 2016-05-07 DIAGNOSIS — C61 Malignant neoplasm of prostate: Secondary | ICD-10-CM | POA: Diagnosis not present

## 2016-05-07 DIAGNOSIS — R351 Nocturia: Secondary | ICD-10-CM | POA: Diagnosis not present

## 2016-05-07 DIAGNOSIS — R339 Retention of urine, unspecified: Secondary | ICD-10-CM | POA: Diagnosis not present

## 2016-05-10 DIAGNOSIS — Z6822 Body mass index (BMI) 22.0-22.9, adult: Secondary | ICD-10-CM | POA: Diagnosis not present

## 2016-05-10 DIAGNOSIS — I4891 Unspecified atrial fibrillation: Secondary | ICD-10-CM | POA: Diagnosis not present

## 2016-05-10 DIAGNOSIS — I959 Hypotension, unspecified: Secondary | ICD-10-CM | POA: Diagnosis not present

## 2016-12-08 DIAGNOSIS — B029 Zoster without complications: Secondary | ICD-10-CM | POA: Diagnosis not present

## 2016-12-24 DIAGNOSIS — I4891 Unspecified atrial fibrillation: Secondary | ICD-10-CM | POA: Diagnosis not present

## 2016-12-24 DIAGNOSIS — Z6822 Body mass index (BMI) 22.0-22.9, adult: Secondary | ICD-10-CM | POA: Diagnosis not present

## 2016-12-24 DIAGNOSIS — Z9181 History of falling: Secondary | ICD-10-CM | POA: Diagnosis not present

## 2016-12-24 DIAGNOSIS — Z1389 Encounter for screening for other disorder: Secondary | ICD-10-CM | POA: Diagnosis not present

## 2016-12-24 DIAGNOSIS — B029 Zoster without complications: Secondary | ICD-10-CM | POA: Diagnosis not present

## 2017-02-18 DIAGNOSIS — H1131 Conjunctival hemorrhage, right eye: Secondary | ICD-10-CM | POA: Diagnosis not present

## 2017-02-18 DIAGNOSIS — Z6822 Body mass index (BMI) 22.0-22.9, adult: Secondary | ICD-10-CM | POA: Diagnosis not present

## 2017-05-22 DIAGNOSIS — Z139 Encounter for screening, unspecified: Secondary | ICD-10-CM | POA: Diagnosis not present

## 2017-05-22 DIAGNOSIS — Z0000 Encounter for general adult medical examination without abnormal findings: Secondary | ICD-10-CM | POA: Diagnosis not present

## 2017-05-22 DIAGNOSIS — Z9181 History of falling: Secondary | ICD-10-CM | POA: Diagnosis not present

## 2017-05-22 DIAGNOSIS — Z1389 Encounter for screening for other disorder: Secondary | ICD-10-CM | POA: Diagnosis not present

## 2017-05-22 DIAGNOSIS — Z125 Encounter for screening for malignant neoplasm of prostate: Secondary | ICD-10-CM | POA: Diagnosis not present

## 2017-07-01 DIAGNOSIS — I4891 Unspecified atrial fibrillation: Secondary | ICD-10-CM | POA: Diagnosis not present

## 2017-07-01 DIAGNOSIS — Z79899 Other long term (current) drug therapy: Secondary | ICD-10-CM | POA: Diagnosis not present

## 2017-07-01 DIAGNOSIS — R600 Localized edema: Secondary | ICD-10-CM | POA: Diagnosis not present

## 2017-07-12 DIAGNOSIS — I10 Essential (primary) hypertension: Secondary | ICD-10-CM | POA: Diagnosis not present

## 2017-07-12 DIAGNOSIS — K5900 Constipation, unspecified: Secondary | ICD-10-CM | POA: Diagnosis not present

## 2017-07-12 DIAGNOSIS — R55 Syncope and collapse: Secondary | ICD-10-CM | POA: Diagnosis not present

## 2017-07-12 DIAGNOSIS — R42 Dizziness and giddiness: Secondary | ICD-10-CM | POA: Diagnosis not present

## 2017-07-12 DIAGNOSIS — R05 Cough: Secondary | ICD-10-CM | POA: Diagnosis not present

## 2017-07-12 DIAGNOSIS — E860 Dehydration: Secondary | ICD-10-CM | POA: Diagnosis not present

## 2017-07-12 DIAGNOSIS — R490 Dysphonia: Secondary | ICD-10-CM | POA: Diagnosis not present

## 2017-07-12 DIAGNOSIS — N400 Enlarged prostate without lower urinary tract symptoms: Secondary | ICD-10-CM | POA: Diagnosis not present

## 2017-07-12 DIAGNOSIS — R748 Abnormal levels of other serum enzymes: Secondary | ICD-10-CM | POA: Diagnosis not present

## 2017-07-13 DIAGNOSIS — R55 Syncope and collapse: Secondary | ICD-10-CM | POA: Diagnosis not present

## 2017-07-13 DIAGNOSIS — N400 Enlarged prostate without lower urinary tract symptoms: Secondary | ICD-10-CM | POA: Diagnosis not present

## 2017-07-13 DIAGNOSIS — Z8546 Personal history of malignant neoplasm of prostate: Secondary | ICD-10-CM | POA: Diagnosis not present

## 2017-07-13 DIAGNOSIS — R7989 Other specified abnormal findings of blood chemistry: Secondary | ICD-10-CM | POA: Diagnosis not present

## 2017-07-22 DIAGNOSIS — R55 Syncope and collapse: Secondary | ICD-10-CM | POA: Diagnosis not present

## 2017-07-22 DIAGNOSIS — E860 Dehydration: Secondary | ICD-10-CM | POA: Diagnosis not present

## 2017-07-22 DIAGNOSIS — I4891 Unspecified atrial fibrillation: Secondary | ICD-10-CM | POA: Diagnosis not present

## 2017-07-22 DIAGNOSIS — E039 Hypothyroidism, unspecified: Secondary | ICD-10-CM | POA: Diagnosis not present

## 2017-07-22 DIAGNOSIS — Z6822 Body mass index (BMI) 22.0-22.9, adult: Secondary | ICD-10-CM | POA: Diagnosis not present

## 2017-07-28 DIAGNOSIS — Z6822 Body mass index (BMI) 22.0-22.9, adult: Secondary | ICD-10-CM | POA: Diagnosis not present

## 2017-07-28 DIAGNOSIS — F4321 Adjustment disorder with depressed mood: Secondary | ICD-10-CM | POA: Diagnosis not present

## 2017-08-05 DIAGNOSIS — F4321 Adjustment disorder with depressed mood: Secondary | ICD-10-CM | POA: Diagnosis not present

## 2017-08-05 DIAGNOSIS — I4891 Unspecified atrial fibrillation: Secondary | ICD-10-CM | POA: Diagnosis not present

## 2017-09-16 DIAGNOSIS — I872 Venous insufficiency (chronic) (peripheral): Secondary | ICD-10-CM | POA: Diagnosis not present

## 2017-09-16 DIAGNOSIS — Z6823 Body mass index (BMI) 23.0-23.9, adult: Secondary | ICD-10-CM | POA: Diagnosis not present

## 2017-12-18 DIAGNOSIS — S0101XA Laceration without foreign body of scalp, initial encounter: Secondary | ICD-10-CM | POA: Diagnosis not present

## 2017-12-18 DIAGNOSIS — M25512 Pain in left shoulder: Secondary | ICD-10-CM | POA: Diagnosis not present

## 2017-12-18 DIAGNOSIS — T148XXA Other injury of unspecified body region, initial encounter: Secondary | ICD-10-CM | POA: Diagnosis not present

## 2017-12-18 DIAGNOSIS — S0003XA Contusion of scalp, initial encounter: Secondary | ICD-10-CM | POA: Diagnosis not present

## 2017-12-18 DIAGNOSIS — I10 Essential (primary) hypertension: Secondary | ICD-10-CM | POA: Diagnosis not present

## 2017-12-18 DIAGNOSIS — Z7982 Long term (current) use of aspirin: Secondary | ICD-10-CM | POA: Diagnosis not present

## 2017-12-18 DIAGNOSIS — S43005A Unspecified dislocation of left shoulder joint, initial encounter: Secondary | ICD-10-CM | POA: Diagnosis not present

## 2017-12-18 DIAGNOSIS — S199XXA Unspecified injury of neck, initial encounter: Secondary | ICD-10-CM | POA: Diagnosis not present

## 2017-12-18 DIAGNOSIS — M5030 Other cervical disc degeneration, unspecified cervical region: Secondary | ICD-10-CM | POA: Diagnosis not present

## 2017-12-18 DIAGNOSIS — S43015A Anterior dislocation of left humerus, initial encounter: Secondary | ICD-10-CM | POA: Diagnosis not present

## 2017-12-23 DIAGNOSIS — S0101XD Laceration without foreign body of scalp, subsequent encounter: Secondary | ICD-10-CM | POA: Diagnosis not present

## 2017-12-23 DIAGNOSIS — Z6823 Body mass index (BMI) 23.0-23.9, adult: Secondary | ICD-10-CM | POA: Diagnosis not present

## 2017-12-23 DIAGNOSIS — R296 Repeated falls: Secondary | ICD-10-CM | POA: Diagnosis not present

## 2017-12-23 DIAGNOSIS — S43005D Unspecified dislocation of left shoulder joint, subsequent encounter: Secondary | ICD-10-CM | POA: Diagnosis not present

## 2018-01-14 DIAGNOSIS — S43005A Unspecified dislocation of left shoulder joint, initial encounter: Secondary | ICD-10-CM | POA: Diagnosis not present

## 2018-01-22 DIAGNOSIS — R600 Localized edema: Secondary | ICD-10-CM | POA: Diagnosis not present

## 2018-01-22 DIAGNOSIS — S43005D Unspecified dislocation of left shoulder joint, subsequent encounter: Secondary | ICD-10-CM | POA: Diagnosis not present

## 2018-01-22 DIAGNOSIS — I4891 Unspecified atrial fibrillation: Secondary | ICD-10-CM | POA: Diagnosis not present

## 2018-01-22 DIAGNOSIS — R3911 Hesitancy of micturition: Secondary | ICD-10-CM | POA: Diagnosis not present

## 2018-01-23 DIAGNOSIS — X58XXXA Exposure to other specified factors, initial encounter: Secondary | ICD-10-CM | POA: Diagnosis not present

## 2018-01-23 DIAGNOSIS — M25412 Effusion, left shoulder: Secondary | ICD-10-CM | POA: Diagnosis not present

## 2018-01-23 DIAGNOSIS — S46812A Strain of other muscles, fascia and tendons at shoulder and upper arm level, left arm, initial encounter: Secondary | ICD-10-CM | POA: Diagnosis not present

## 2018-01-23 DIAGNOSIS — R600 Localized edema: Secondary | ICD-10-CM | POA: Diagnosis not present

## 2018-01-23 DIAGNOSIS — S43005A Unspecified dislocation of left shoulder joint, initial encounter: Secondary | ICD-10-CM | POA: Diagnosis not present

## 2018-01-27 DIAGNOSIS — S43005A Unspecified dislocation of left shoulder joint, initial encounter: Secondary | ICD-10-CM | POA: Diagnosis not present

## 2018-01-29 DIAGNOSIS — S43005D Unspecified dislocation of left shoulder joint, subsequent encounter: Secondary | ICD-10-CM | POA: Diagnosis not present

## 2018-01-29 DIAGNOSIS — Z602 Problems related to living alone: Secondary | ICD-10-CM | POA: Diagnosis not present

## 2018-01-29 DIAGNOSIS — Z8546 Personal history of malignant neoplasm of prostate: Secondary | ICD-10-CM | POA: Diagnosis not present

## 2018-01-29 DIAGNOSIS — R200 Anesthesia of skin: Secondary | ICD-10-CM | POA: Diagnosis not present

## 2018-02-03 DIAGNOSIS — Z8546 Personal history of malignant neoplasm of prostate: Secondary | ICD-10-CM | POA: Diagnosis not present

## 2018-02-03 DIAGNOSIS — Z602 Problems related to living alone: Secondary | ICD-10-CM | POA: Diagnosis not present

## 2018-02-03 DIAGNOSIS — S43005D Unspecified dislocation of left shoulder joint, subsequent encounter: Secondary | ICD-10-CM | POA: Diagnosis not present

## 2018-02-03 DIAGNOSIS — R200 Anesthesia of skin: Secondary | ICD-10-CM | POA: Diagnosis not present

## 2018-02-04 DIAGNOSIS — R200 Anesthesia of skin: Secondary | ICD-10-CM | POA: Diagnosis not present

## 2018-02-04 DIAGNOSIS — Z8546 Personal history of malignant neoplasm of prostate: Secondary | ICD-10-CM | POA: Diagnosis not present

## 2018-02-04 DIAGNOSIS — S43005D Unspecified dislocation of left shoulder joint, subsequent encounter: Secondary | ICD-10-CM | POA: Diagnosis not present

## 2018-02-04 DIAGNOSIS — M5412 Radiculopathy, cervical region: Secondary | ICD-10-CM | POA: Diagnosis not present

## 2018-02-04 DIAGNOSIS — Z602 Problems related to living alone: Secondary | ICD-10-CM | POA: Diagnosis not present

## 2018-02-11 DIAGNOSIS — Z8546 Personal history of malignant neoplasm of prostate: Secondary | ICD-10-CM | POA: Diagnosis not present

## 2018-02-11 DIAGNOSIS — S43005D Unspecified dislocation of left shoulder joint, subsequent encounter: Secondary | ICD-10-CM | POA: Diagnosis not present

## 2018-02-11 DIAGNOSIS — Z602 Problems related to living alone: Secondary | ICD-10-CM | POA: Diagnosis not present

## 2018-02-11 DIAGNOSIS — R200 Anesthesia of skin: Secondary | ICD-10-CM | POA: Diagnosis not present

## 2018-02-13 DIAGNOSIS — S43005D Unspecified dislocation of left shoulder joint, subsequent encounter: Secondary | ICD-10-CM | POA: Diagnosis not present

## 2018-02-13 DIAGNOSIS — Z8546 Personal history of malignant neoplasm of prostate: Secondary | ICD-10-CM | POA: Diagnosis not present

## 2018-02-13 DIAGNOSIS — Z602 Problems related to living alone: Secondary | ICD-10-CM | POA: Diagnosis not present

## 2018-02-13 DIAGNOSIS — R200 Anesthesia of skin: Secondary | ICD-10-CM | POA: Diagnosis not present

## 2018-02-17 DIAGNOSIS — Z8546 Personal history of malignant neoplasm of prostate: Secondary | ICD-10-CM | POA: Diagnosis not present

## 2018-02-17 DIAGNOSIS — R200 Anesthesia of skin: Secondary | ICD-10-CM | POA: Diagnosis not present

## 2018-02-17 DIAGNOSIS — S43005D Unspecified dislocation of left shoulder joint, subsequent encounter: Secondary | ICD-10-CM | POA: Diagnosis not present

## 2018-02-17 DIAGNOSIS — Z602 Problems related to living alone: Secondary | ICD-10-CM | POA: Diagnosis not present

## 2018-02-20 DIAGNOSIS — Z8546 Personal history of malignant neoplasm of prostate: Secondary | ICD-10-CM | POA: Diagnosis not present

## 2018-02-20 DIAGNOSIS — S43005D Unspecified dislocation of left shoulder joint, subsequent encounter: Secondary | ICD-10-CM | POA: Diagnosis not present

## 2018-02-20 DIAGNOSIS — R200 Anesthesia of skin: Secondary | ICD-10-CM | POA: Diagnosis not present

## 2018-02-20 DIAGNOSIS — Z602 Problems related to living alone: Secondary | ICD-10-CM | POA: Diagnosis not present

## 2018-03-03 DIAGNOSIS — R200 Anesthesia of skin: Secondary | ICD-10-CM | POA: Diagnosis not present

## 2018-03-03 DIAGNOSIS — S43005A Unspecified dislocation of left shoulder joint, initial encounter: Secondary | ICD-10-CM | POA: Diagnosis not present

## 2018-03-18 DIAGNOSIS — M67912 Unspecified disorder of synovium and tendon, left shoulder: Secondary | ICD-10-CM | POA: Diagnosis not present

## 2018-03-18 DIAGNOSIS — M79642 Pain in left hand: Secondary | ICD-10-CM | POA: Diagnosis not present

## 2018-04-29 DIAGNOSIS — M67912 Unspecified disorder of synovium and tendon, left shoulder: Secondary | ICD-10-CM | POA: Diagnosis not present

## 2018-04-30 DIAGNOSIS — S43005D Unspecified dislocation of left shoulder joint, subsequent encounter: Secondary | ICD-10-CM | POA: Diagnosis not present

## 2018-04-30 DIAGNOSIS — Z9181 History of falling: Secondary | ICD-10-CM | POA: Diagnosis not present

## 2018-04-30 DIAGNOSIS — Z791 Long term (current) use of non-steroidal anti-inflammatories (NSAID): Secondary | ICD-10-CM | POA: Diagnosis not present

## 2018-04-30 DIAGNOSIS — R200 Anesthesia of skin: Secondary | ICD-10-CM | POA: Diagnosis not present

## 2018-04-30 DIAGNOSIS — Z7982 Long term (current) use of aspirin: Secondary | ICD-10-CM | POA: Diagnosis not present

## 2018-04-30 DIAGNOSIS — Z602 Problems related to living alone: Secondary | ICD-10-CM | POA: Diagnosis not present

## 2018-04-30 DIAGNOSIS — Z8546 Personal history of malignant neoplasm of prostate: Secondary | ICD-10-CM | POA: Diagnosis not present

## 2018-05-06 DIAGNOSIS — S43005D Unspecified dislocation of left shoulder joint, subsequent encounter: Secondary | ICD-10-CM | POA: Diagnosis not present

## 2018-05-06 DIAGNOSIS — Z7982 Long term (current) use of aspirin: Secondary | ICD-10-CM | POA: Diagnosis not present

## 2018-05-06 DIAGNOSIS — Z8546 Personal history of malignant neoplasm of prostate: Secondary | ICD-10-CM | POA: Diagnosis not present

## 2018-05-06 DIAGNOSIS — R200 Anesthesia of skin: Secondary | ICD-10-CM | POA: Diagnosis not present

## 2018-05-06 DIAGNOSIS — Z602 Problems related to living alone: Secondary | ICD-10-CM | POA: Diagnosis not present

## 2018-05-06 DIAGNOSIS — Z9181 History of falling: Secondary | ICD-10-CM | POA: Diagnosis not present

## 2018-05-06 DIAGNOSIS — Z791 Long term (current) use of non-steroidal anti-inflammatories (NSAID): Secondary | ICD-10-CM | POA: Diagnosis not present

## 2018-05-08 DIAGNOSIS — R200 Anesthesia of skin: Secondary | ICD-10-CM | POA: Diagnosis not present

## 2018-05-08 DIAGNOSIS — S43005D Unspecified dislocation of left shoulder joint, subsequent encounter: Secondary | ICD-10-CM | POA: Diagnosis not present

## 2018-05-08 DIAGNOSIS — Z9181 History of falling: Secondary | ICD-10-CM | POA: Diagnosis not present

## 2018-05-08 DIAGNOSIS — Z602 Problems related to living alone: Secondary | ICD-10-CM | POA: Diagnosis not present

## 2018-05-08 DIAGNOSIS — Z791 Long term (current) use of non-steroidal anti-inflammatories (NSAID): Secondary | ICD-10-CM | POA: Diagnosis not present

## 2018-05-08 DIAGNOSIS — Z8546 Personal history of malignant neoplasm of prostate: Secondary | ICD-10-CM | POA: Diagnosis not present

## 2018-05-08 DIAGNOSIS — Z7982 Long term (current) use of aspirin: Secondary | ICD-10-CM | POA: Diagnosis not present

## 2018-05-13 DIAGNOSIS — Z602 Problems related to living alone: Secondary | ICD-10-CM | POA: Diagnosis not present

## 2018-05-13 DIAGNOSIS — Z9181 History of falling: Secondary | ICD-10-CM | POA: Diagnosis not present

## 2018-05-13 DIAGNOSIS — R200 Anesthesia of skin: Secondary | ICD-10-CM | POA: Diagnosis not present

## 2018-05-13 DIAGNOSIS — Z7982 Long term (current) use of aspirin: Secondary | ICD-10-CM | POA: Diagnosis not present

## 2018-05-13 DIAGNOSIS — S43005D Unspecified dislocation of left shoulder joint, subsequent encounter: Secondary | ICD-10-CM | POA: Diagnosis not present

## 2018-05-13 DIAGNOSIS — Z8546 Personal history of malignant neoplasm of prostate: Secondary | ICD-10-CM | POA: Diagnosis not present

## 2018-05-13 DIAGNOSIS — Z791 Long term (current) use of non-steroidal anti-inflammatories (NSAID): Secondary | ICD-10-CM | POA: Diagnosis not present

## 2018-05-15 DIAGNOSIS — Z602 Problems related to living alone: Secondary | ICD-10-CM | POA: Diagnosis not present

## 2018-05-15 DIAGNOSIS — Z9181 History of falling: Secondary | ICD-10-CM | POA: Diagnosis not present

## 2018-05-15 DIAGNOSIS — Z791 Long term (current) use of non-steroidal anti-inflammatories (NSAID): Secondary | ICD-10-CM | POA: Diagnosis not present

## 2018-05-15 DIAGNOSIS — Z7982 Long term (current) use of aspirin: Secondary | ICD-10-CM | POA: Diagnosis not present

## 2018-05-15 DIAGNOSIS — S43005D Unspecified dislocation of left shoulder joint, subsequent encounter: Secondary | ICD-10-CM | POA: Diagnosis not present

## 2018-05-15 DIAGNOSIS — Z8546 Personal history of malignant neoplasm of prostate: Secondary | ICD-10-CM | POA: Diagnosis not present

## 2018-05-15 DIAGNOSIS — R200 Anesthesia of skin: Secondary | ICD-10-CM | POA: Diagnosis not present

## 2018-05-19 DIAGNOSIS — S43005D Unspecified dislocation of left shoulder joint, subsequent encounter: Secondary | ICD-10-CM | POA: Diagnosis not present

## 2018-05-19 DIAGNOSIS — Z8546 Personal history of malignant neoplasm of prostate: Secondary | ICD-10-CM | POA: Diagnosis not present

## 2018-05-19 DIAGNOSIS — R200 Anesthesia of skin: Secondary | ICD-10-CM | POA: Diagnosis not present

## 2018-05-19 DIAGNOSIS — Z791 Long term (current) use of non-steroidal anti-inflammatories (NSAID): Secondary | ICD-10-CM | POA: Diagnosis not present

## 2018-05-19 DIAGNOSIS — Z7982 Long term (current) use of aspirin: Secondary | ICD-10-CM | POA: Diagnosis not present

## 2018-05-19 DIAGNOSIS — Z602 Problems related to living alone: Secondary | ICD-10-CM | POA: Diagnosis not present

## 2018-05-19 DIAGNOSIS — Z9181 History of falling: Secondary | ICD-10-CM | POA: Diagnosis not present

## 2018-05-20 DIAGNOSIS — Z791 Long term (current) use of non-steroidal anti-inflammatories (NSAID): Secondary | ICD-10-CM | POA: Diagnosis not present

## 2018-05-20 DIAGNOSIS — Z9181 History of falling: Secondary | ICD-10-CM | POA: Diagnosis not present

## 2018-05-20 DIAGNOSIS — Z7982 Long term (current) use of aspirin: Secondary | ICD-10-CM | POA: Diagnosis not present

## 2018-05-20 DIAGNOSIS — Z602 Problems related to living alone: Secondary | ICD-10-CM | POA: Diagnosis not present

## 2018-05-20 DIAGNOSIS — S43005D Unspecified dislocation of left shoulder joint, subsequent encounter: Secondary | ICD-10-CM | POA: Diagnosis not present

## 2018-05-20 DIAGNOSIS — R200 Anesthesia of skin: Secondary | ICD-10-CM | POA: Diagnosis not present

## 2018-05-20 DIAGNOSIS — Z8546 Personal history of malignant neoplasm of prostate: Secondary | ICD-10-CM | POA: Diagnosis not present

## 2018-05-21 DIAGNOSIS — Z602 Problems related to living alone: Secondary | ICD-10-CM | POA: Diagnosis not present

## 2018-05-21 DIAGNOSIS — Z9181 History of falling: Secondary | ICD-10-CM | POA: Diagnosis not present

## 2018-05-21 DIAGNOSIS — S43005D Unspecified dislocation of left shoulder joint, subsequent encounter: Secondary | ICD-10-CM | POA: Diagnosis not present

## 2018-05-21 DIAGNOSIS — Z7982 Long term (current) use of aspirin: Secondary | ICD-10-CM | POA: Diagnosis not present

## 2018-05-21 DIAGNOSIS — Z791 Long term (current) use of non-steroidal anti-inflammatories (NSAID): Secondary | ICD-10-CM | POA: Diagnosis not present

## 2018-05-21 DIAGNOSIS — R200 Anesthesia of skin: Secondary | ICD-10-CM | POA: Diagnosis not present

## 2018-05-21 DIAGNOSIS — Z8546 Personal history of malignant neoplasm of prostate: Secondary | ICD-10-CM | POA: Diagnosis not present

## 2018-05-22 DIAGNOSIS — Z8546 Personal history of malignant neoplasm of prostate: Secondary | ICD-10-CM | POA: Diagnosis not present

## 2018-05-22 DIAGNOSIS — R200 Anesthesia of skin: Secondary | ICD-10-CM | POA: Diagnosis not present

## 2018-05-22 DIAGNOSIS — Z9181 History of falling: Secondary | ICD-10-CM | POA: Diagnosis not present

## 2018-05-22 DIAGNOSIS — S43005D Unspecified dislocation of left shoulder joint, subsequent encounter: Secondary | ICD-10-CM | POA: Diagnosis not present

## 2018-05-22 DIAGNOSIS — Z602 Problems related to living alone: Secondary | ICD-10-CM | POA: Diagnosis not present

## 2018-05-22 DIAGNOSIS — Z791 Long term (current) use of non-steroidal anti-inflammatories (NSAID): Secondary | ICD-10-CM | POA: Diagnosis not present

## 2018-05-22 DIAGNOSIS — Z7982 Long term (current) use of aspirin: Secondary | ICD-10-CM | POA: Diagnosis not present

## 2018-05-25 DIAGNOSIS — Z791 Long term (current) use of non-steroidal anti-inflammatories (NSAID): Secondary | ICD-10-CM | POA: Diagnosis not present

## 2018-05-25 DIAGNOSIS — R200 Anesthesia of skin: Secondary | ICD-10-CM | POA: Diagnosis not present

## 2018-05-25 DIAGNOSIS — Z7982 Long term (current) use of aspirin: Secondary | ICD-10-CM | POA: Diagnosis not present

## 2018-05-25 DIAGNOSIS — Z9181 History of falling: Secondary | ICD-10-CM | POA: Diagnosis not present

## 2018-05-25 DIAGNOSIS — S43005D Unspecified dislocation of left shoulder joint, subsequent encounter: Secondary | ICD-10-CM | POA: Diagnosis not present

## 2018-05-25 DIAGNOSIS — Z602 Problems related to living alone: Secondary | ICD-10-CM | POA: Diagnosis not present

## 2018-05-25 DIAGNOSIS — Z8546 Personal history of malignant neoplasm of prostate: Secondary | ICD-10-CM | POA: Diagnosis not present

## 2018-05-26 DIAGNOSIS — Z9181 History of falling: Secondary | ICD-10-CM | POA: Diagnosis not present

## 2018-05-26 DIAGNOSIS — Z791 Long term (current) use of non-steroidal anti-inflammatories (NSAID): Secondary | ICD-10-CM | POA: Diagnosis not present

## 2018-05-26 DIAGNOSIS — I4891 Unspecified atrial fibrillation: Secondary | ICD-10-CM | POA: Diagnosis not present

## 2018-05-26 DIAGNOSIS — R200 Anesthesia of skin: Secondary | ICD-10-CM | POA: Diagnosis not present

## 2018-05-26 DIAGNOSIS — Z602 Problems related to living alone: Secondary | ICD-10-CM | POA: Diagnosis not present

## 2018-05-26 DIAGNOSIS — Z8546 Personal history of malignant neoplasm of prostate: Secondary | ICD-10-CM | POA: Diagnosis not present

## 2018-05-26 DIAGNOSIS — R600 Localized edema: Secondary | ICD-10-CM | POA: Diagnosis not present

## 2018-05-26 DIAGNOSIS — S43005D Unspecified dislocation of left shoulder joint, subsequent encounter: Secondary | ICD-10-CM | POA: Diagnosis not present

## 2018-05-26 DIAGNOSIS — G540 Brachial plexus disorders: Secondary | ICD-10-CM | POA: Diagnosis not present

## 2018-05-26 DIAGNOSIS — Z7982 Long term (current) use of aspirin: Secondary | ICD-10-CM | POA: Diagnosis not present

## 2018-05-27 DIAGNOSIS — Z9181 History of falling: Secondary | ICD-10-CM | POA: Diagnosis not present

## 2018-05-27 DIAGNOSIS — S43005D Unspecified dislocation of left shoulder joint, subsequent encounter: Secondary | ICD-10-CM | POA: Diagnosis not present

## 2018-05-27 DIAGNOSIS — R200 Anesthesia of skin: Secondary | ICD-10-CM | POA: Diagnosis not present

## 2018-05-27 DIAGNOSIS — Z8546 Personal history of malignant neoplasm of prostate: Secondary | ICD-10-CM | POA: Diagnosis not present

## 2018-05-27 DIAGNOSIS — Z791 Long term (current) use of non-steroidal anti-inflammatories (NSAID): Secondary | ICD-10-CM | POA: Diagnosis not present

## 2018-05-27 DIAGNOSIS — Z602 Problems related to living alone: Secondary | ICD-10-CM | POA: Diagnosis not present

## 2018-05-27 DIAGNOSIS — Z7982 Long term (current) use of aspirin: Secondary | ICD-10-CM | POA: Diagnosis not present

## 2018-05-28 DIAGNOSIS — Z791 Long term (current) use of non-steroidal anti-inflammatories (NSAID): Secondary | ICD-10-CM | POA: Diagnosis not present

## 2018-05-28 DIAGNOSIS — S43005D Unspecified dislocation of left shoulder joint, subsequent encounter: Secondary | ICD-10-CM | POA: Diagnosis not present

## 2018-05-28 DIAGNOSIS — Z8546 Personal history of malignant neoplasm of prostate: Secondary | ICD-10-CM | POA: Diagnosis not present

## 2018-05-28 DIAGNOSIS — Z7982 Long term (current) use of aspirin: Secondary | ICD-10-CM | POA: Diagnosis not present

## 2018-05-28 DIAGNOSIS — R200 Anesthesia of skin: Secondary | ICD-10-CM | POA: Diagnosis not present

## 2018-05-28 DIAGNOSIS — Z602 Problems related to living alone: Secondary | ICD-10-CM | POA: Diagnosis not present

## 2018-05-28 DIAGNOSIS — Z9181 History of falling: Secondary | ICD-10-CM | POA: Diagnosis not present

## 2018-06-15 DIAGNOSIS — Z1331 Encounter for screening for depression: Secondary | ICD-10-CM | POA: Diagnosis not present

## 2018-06-15 DIAGNOSIS — Z1339 Encounter for screening examination for other mental health and behavioral disorders: Secondary | ICD-10-CM | POA: Diagnosis not present

## 2018-06-15 DIAGNOSIS — Z139 Encounter for screening, unspecified: Secondary | ICD-10-CM | POA: Diagnosis not present

## 2018-06-15 DIAGNOSIS — Z0000 Encounter for general adult medical examination without abnormal findings: Secondary | ICD-10-CM | POA: Diagnosis not present

## 2018-06-15 DIAGNOSIS — Z125 Encounter for screening for malignant neoplasm of prostate: Secondary | ICD-10-CM | POA: Diagnosis not present

## 2018-06-15 DIAGNOSIS — Z9181 History of falling: Secondary | ICD-10-CM | POA: Diagnosis not present

## 2018-06-17 DIAGNOSIS — R339 Retention of urine, unspecified: Secondary | ICD-10-CM | POA: Diagnosis not present

## 2018-06-17 DIAGNOSIS — C61 Malignant neoplasm of prostate: Secondary | ICD-10-CM | POA: Diagnosis not present

## 2018-06-17 DIAGNOSIS — R351 Nocturia: Secondary | ICD-10-CM | POA: Diagnosis not present

## 2018-07-24 DIAGNOSIS — Z6821 Body mass index (BMI) 21.0-21.9, adult: Secondary | ICD-10-CM | POA: Diagnosis not present

## 2018-07-24 DIAGNOSIS — R3911 Hesitancy of micturition: Secondary | ICD-10-CM | POA: Diagnosis not present

## 2018-07-24 DIAGNOSIS — Z79899 Other long term (current) drug therapy: Secondary | ICD-10-CM | POA: Diagnosis not present

## 2018-07-24 DIAGNOSIS — I4891 Unspecified atrial fibrillation: Secondary | ICD-10-CM | POA: Diagnosis not present

## 2018-07-24 DIAGNOSIS — R600 Localized edema: Secondary | ICD-10-CM | POA: Diagnosis not present

## 2018-07-24 DIAGNOSIS — S43005D Unspecified dislocation of left shoulder joint, subsequent encounter: Secondary | ICD-10-CM | POA: Diagnosis not present

## 2018-08-08 DIAGNOSIS — Z9181 History of falling: Secondary | ICD-10-CM | POA: Diagnosis not present

## 2018-08-08 DIAGNOSIS — I4891 Unspecified atrial fibrillation: Secondary | ICD-10-CM | POA: Diagnosis not present

## 2018-08-08 DIAGNOSIS — R200 Anesthesia of skin: Secondary | ICD-10-CM | POA: Diagnosis not present

## 2018-08-08 DIAGNOSIS — S43005D Unspecified dislocation of left shoulder joint, subsequent encounter: Secondary | ICD-10-CM | POA: Diagnosis not present

## 2018-08-08 DIAGNOSIS — Z602 Problems related to living alone: Secondary | ICD-10-CM | POA: Diagnosis not present

## 2018-08-08 DIAGNOSIS — Z8546 Personal history of malignant neoplasm of prostate: Secondary | ICD-10-CM | POA: Diagnosis not present

## 2018-08-08 DIAGNOSIS — G540 Brachial plexus disorders: Secondary | ICD-10-CM | POA: Diagnosis not present

## 2018-08-08 DIAGNOSIS — Z7982 Long term (current) use of aspirin: Secondary | ICD-10-CM | POA: Diagnosis not present

## 2018-08-08 DIAGNOSIS — Z7901 Long term (current) use of anticoagulants: Secondary | ICD-10-CM | POA: Diagnosis not present

## 2018-08-10 DIAGNOSIS — Z7982 Long term (current) use of aspirin: Secondary | ICD-10-CM | POA: Diagnosis not present

## 2018-08-10 DIAGNOSIS — G540 Brachial plexus disorders: Secondary | ICD-10-CM | POA: Diagnosis not present

## 2018-08-10 DIAGNOSIS — Z7901 Long term (current) use of anticoagulants: Secondary | ICD-10-CM | POA: Diagnosis not present

## 2018-08-10 DIAGNOSIS — I4891 Unspecified atrial fibrillation: Secondary | ICD-10-CM | POA: Diagnosis not present

## 2018-08-10 DIAGNOSIS — S43005D Unspecified dislocation of left shoulder joint, subsequent encounter: Secondary | ICD-10-CM | POA: Diagnosis not present

## 2018-08-10 DIAGNOSIS — R200 Anesthesia of skin: Secondary | ICD-10-CM | POA: Diagnosis not present

## 2018-08-10 DIAGNOSIS — Z8546 Personal history of malignant neoplasm of prostate: Secondary | ICD-10-CM | POA: Diagnosis not present

## 2018-08-10 DIAGNOSIS — Z602 Problems related to living alone: Secondary | ICD-10-CM | POA: Diagnosis not present

## 2018-08-10 DIAGNOSIS — Z9181 History of falling: Secondary | ICD-10-CM | POA: Diagnosis not present

## 2018-08-12 DIAGNOSIS — Z8546 Personal history of malignant neoplasm of prostate: Secondary | ICD-10-CM | POA: Diagnosis not present

## 2018-08-12 DIAGNOSIS — Z9181 History of falling: Secondary | ICD-10-CM | POA: Diagnosis not present

## 2018-08-12 DIAGNOSIS — G540 Brachial plexus disorders: Secondary | ICD-10-CM | POA: Diagnosis not present

## 2018-08-12 DIAGNOSIS — Z7982 Long term (current) use of aspirin: Secondary | ICD-10-CM | POA: Diagnosis not present

## 2018-08-12 DIAGNOSIS — S43005D Unspecified dislocation of left shoulder joint, subsequent encounter: Secondary | ICD-10-CM | POA: Diagnosis not present

## 2018-08-12 DIAGNOSIS — Z602 Problems related to living alone: Secondary | ICD-10-CM | POA: Diagnosis not present

## 2018-08-12 DIAGNOSIS — Z7901 Long term (current) use of anticoagulants: Secondary | ICD-10-CM | POA: Diagnosis not present

## 2018-08-12 DIAGNOSIS — R200 Anesthesia of skin: Secondary | ICD-10-CM | POA: Diagnosis not present

## 2018-08-12 DIAGNOSIS — I4891 Unspecified atrial fibrillation: Secondary | ICD-10-CM | POA: Diagnosis not present

## 2018-08-13 DIAGNOSIS — Z8546 Personal history of malignant neoplasm of prostate: Secondary | ICD-10-CM | POA: Diagnosis not present

## 2018-08-13 DIAGNOSIS — I4891 Unspecified atrial fibrillation: Secondary | ICD-10-CM | POA: Diagnosis not present

## 2018-08-13 DIAGNOSIS — G540 Brachial plexus disorders: Secondary | ICD-10-CM | POA: Diagnosis not present

## 2018-08-13 DIAGNOSIS — S43005D Unspecified dislocation of left shoulder joint, subsequent encounter: Secondary | ICD-10-CM | POA: Diagnosis not present

## 2018-08-13 DIAGNOSIS — R200 Anesthesia of skin: Secondary | ICD-10-CM | POA: Diagnosis not present

## 2018-08-13 DIAGNOSIS — Z7901 Long term (current) use of anticoagulants: Secondary | ICD-10-CM | POA: Diagnosis not present

## 2018-08-13 DIAGNOSIS — Z7982 Long term (current) use of aspirin: Secondary | ICD-10-CM | POA: Diagnosis not present

## 2018-08-13 DIAGNOSIS — Z9181 History of falling: Secondary | ICD-10-CM | POA: Diagnosis not present

## 2018-08-13 DIAGNOSIS — Z602 Problems related to living alone: Secondary | ICD-10-CM | POA: Diagnosis not present

## 2018-08-18 DIAGNOSIS — Z9181 History of falling: Secondary | ICD-10-CM | POA: Diagnosis not present

## 2018-08-18 DIAGNOSIS — R200 Anesthesia of skin: Secondary | ICD-10-CM | POA: Diagnosis not present

## 2018-08-18 DIAGNOSIS — Z602 Problems related to living alone: Secondary | ICD-10-CM | POA: Diagnosis not present

## 2018-08-18 DIAGNOSIS — Z7901 Long term (current) use of anticoagulants: Secondary | ICD-10-CM | POA: Diagnosis not present

## 2018-08-18 DIAGNOSIS — S43005D Unspecified dislocation of left shoulder joint, subsequent encounter: Secondary | ICD-10-CM | POA: Diagnosis not present

## 2018-08-18 DIAGNOSIS — Z7982 Long term (current) use of aspirin: Secondary | ICD-10-CM | POA: Diagnosis not present

## 2018-08-18 DIAGNOSIS — G540 Brachial plexus disorders: Secondary | ICD-10-CM | POA: Diagnosis not present

## 2018-08-18 DIAGNOSIS — I4891 Unspecified atrial fibrillation: Secondary | ICD-10-CM | POA: Diagnosis not present

## 2018-08-18 DIAGNOSIS — Z8546 Personal history of malignant neoplasm of prostate: Secondary | ICD-10-CM | POA: Diagnosis not present

## 2018-08-19 DIAGNOSIS — Z602 Problems related to living alone: Secondary | ICD-10-CM | POA: Diagnosis not present

## 2018-08-19 DIAGNOSIS — I4891 Unspecified atrial fibrillation: Secondary | ICD-10-CM | POA: Diagnosis not present

## 2018-08-19 DIAGNOSIS — R200 Anesthesia of skin: Secondary | ICD-10-CM | POA: Diagnosis not present

## 2018-08-19 DIAGNOSIS — G540 Brachial plexus disorders: Secondary | ICD-10-CM | POA: Diagnosis not present

## 2018-08-19 DIAGNOSIS — Z8546 Personal history of malignant neoplasm of prostate: Secondary | ICD-10-CM | POA: Diagnosis not present

## 2018-08-19 DIAGNOSIS — Z9181 History of falling: Secondary | ICD-10-CM | POA: Diagnosis not present

## 2018-08-19 DIAGNOSIS — S43005D Unspecified dislocation of left shoulder joint, subsequent encounter: Secondary | ICD-10-CM | POA: Diagnosis not present

## 2018-08-19 DIAGNOSIS — Z7901 Long term (current) use of anticoagulants: Secondary | ICD-10-CM | POA: Diagnosis not present

## 2018-08-19 DIAGNOSIS — Z7982 Long term (current) use of aspirin: Secondary | ICD-10-CM | POA: Diagnosis not present

## 2018-08-21 DIAGNOSIS — R0789 Other chest pain: Secondary | ICD-10-CM | POA: Diagnosis not present

## 2018-08-21 DIAGNOSIS — Z602 Problems related to living alone: Secondary | ICD-10-CM | POA: Diagnosis not present

## 2018-08-21 DIAGNOSIS — Z7982 Long term (current) use of aspirin: Secondary | ICD-10-CM | POA: Diagnosis not present

## 2018-08-21 DIAGNOSIS — Z9181 History of falling: Secondary | ICD-10-CM | POA: Diagnosis not present

## 2018-08-21 DIAGNOSIS — I4891 Unspecified atrial fibrillation: Secondary | ICD-10-CM | POA: Diagnosis not present

## 2018-08-21 DIAGNOSIS — S2232XA Fracture of one rib, left side, initial encounter for closed fracture: Secondary | ICD-10-CM | POA: Diagnosis not present

## 2018-08-21 DIAGNOSIS — G540 Brachial plexus disorders: Secondary | ICD-10-CM | POA: Diagnosis not present

## 2018-08-21 DIAGNOSIS — S43005D Unspecified dislocation of left shoulder joint, subsequent encounter: Secondary | ICD-10-CM | POA: Diagnosis not present

## 2018-08-21 DIAGNOSIS — Z8546 Personal history of malignant neoplasm of prostate: Secondary | ICD-10-CM | POA: Diagnosis not present

## 2018-08-21 DIAGNOSIS — Z6821 Body mass index (BMI) 21.0-21.9, adult: Secondary | ICD-10-CM | POA: Diagnosis not present

## 2018-08-21 DIAGNOSIS — R200 Anesthesia of skin: Secondary | ICD-10-CM | POA: Diagnosis not present

## 2018-08-21 DIAGNOSIS — Z7901 Long term (current) use of anticoagulants: Secondary | ICD-10-CM | POA: Diagnosis not present

## 2018-08-25 DIAGNOSIS — Z7982 Long term (current) use of aspirin: Secondary | ICD-10-CM | POA: Diagnosis not present

## 2018-08-25 DIAGNOSIS — Z9181 History of falling: Secondary | ICD-10-CM | POA: Diagnosis not present

## 2018-08-25 DIAGNOSIS — Z8546 Personal history of malignant neoplasm of prostate: Secondary | ICD-10-CM | POA: Diagnosis not present

## 2018-08-25 DIAGNOSIS — Z7901 Long term (current) use of anticoagulants: Secondary | ICD-10-CM | POA: Diagnosis not present

## 2018-08-25 DIAGNOSIS — G540 Brachial plexus disorders: Secondary | ICD-10-CM | POA: Diagnosis not present

## 2018-08-25 DIAGNOSIS — Z602 Problems related to living alone: Secondary | ICD-10-CM | POA: Diagnosis not present

## 2018-08-25 DIAGNOSIS — S43005D Unspecified dislocation of left shoulder joint, subsequent encounter: Secondary | ICD-10-CM | POA: Diagnosis not present

## 2018-08-25 DIAGNOSIS — R200 Anesthesia of skin: Secondary | ICD-10-CM | POA: Diagnosis not present

## 2018-08-25 DIAGNOSIS — I4891 Unspecified atrial fibrillation: Secondary | ICD-10-CM | POA: Diagnosis not present

## 2018-08-28 DIAGNOSIS — Z9181 History of falling: Secondary | ICD-10-CM | POA: Diagnosis not present

## 2018-08-28 DIAGNOSIS — Z7982 Long term (current) use of aspirin: Secondary | ICD-10-CM | POA: Diagnosis not present

## 2018-08-28 DIAGNOSIS — I4891 Unspecified atrial fibrillation: Secondary | ICD-10-CM | POA: Diagnosis not present

## 2018-08-28 DIAGNOSIS — Z602 Problems related to living alone: Secondary | ICD-10-CM | POA: Diagnosis not present

## 2018-08-28 DIAGNOSIS — G540 Brachial plexus disorders: Secondary | ICD-10-CM | POA: Diagnosis not present

## 2018-08-28 DIAGNOSIS — S43005D Unspecified dislocation of left shoulder joint, subsequent encounter: Secondary | ICD-10-CM | POA: Diagnosis not present

## 2018-08-28 DIAGNOSIS — R200 Anesthesia of skin: Secondary | ICD-10-CM | POA: Diagnosis not present

## 2018-08-28 DIAGNOSIS — Z8546 Personal history of malignant neoplasm of prostate: Secondary | ICD-10-CM | POA: Diagnosis not present

## 2018-08-28 DIAGNOSIS — Z7901 Long term (current) use of anticoagulants: Secondary | ICD-10-CM | POA: Diagnosis not present

## 2018-09-01 DIAGNOSIS — Z8546 Personal history of malignant neoplasm of prostate: Secondary | ICD-10-CM | POA: Diagnosis not present

## 2018-09-01 DIAGNOSIS — Z9181 History of falling: Secondary | ICD-10-CM | POA: Diagnosis not present

## 2018-09-01 DIAGNOSIS — Z7982 Long term (current) use of aspirin: Secondary | ICD-10-CM | POA: Diagnosis not present

## 2018-09-01 DIAGNOSIS — I4891 Unspecified atrial fibrillation: Secondary | ICD-10-CM | POA: Diagnosis not present

## 2018-09-01 DIAGNOSIS — G540 Brachial plexus disorders: Secondary | ICD-10-CM | POA: Diagnosis not present

## 2018-09-01 DIAGNOSIS — R200 Anesthesia of skin: Secondary | ICD-10-CM | POA: Diagnosis not present

## 2018-09-01 DIAGNOSIS — S43005D Unspecified dislocation of left shoulder joint, subsequent encounter: Secondary | ICD-10-CM | POA: Diagnosis not present

## 2018-09-01 DIAGNOSIS — Z7901 Long term (current) use of anticoagulants: Secondary | ICD-10-CM | POA: Diagnosis not present

## 2018-09-01 DIAGNOSIS — Z602 Problems related to living alone: Secondary | ICD-10-CM | POA: Diagnosis not present

## 2018-09-02 DIAGNOSIS — Z7901 Long term (current) use of anticoagulants: Secondary | ICD-10-CM | POA: Diagnosis not present

## 2018-09-02 DIAGNOSIS — Z602 Problems related to living alone: Secondary | ICD-10-CM | POA: Diagnosis not present

## 2018-09-02 DIAGNOSIS — G540 Brachial plexus disorders: Secondary | ICD-10-CM | POA: Diagnosis not present

## 2018-09-02 DIAGNOSIS — Z7982 Long term (current) use of aspirin: Secondary | ICD-10-CM | POA: Diagnosis not present

## 2018-09-02 DIAGNOSIS — S43005D Unspecified dislocation of left shoulder joint, subsequent encounter: Secondary | ICD-10-CM | POA: Diagnosis not present

## 2018-09-02 DIAGNOSIS — I4891 Unspecified atrial fibrillation: Secondary | ICD-10-CM | POA: Diagnosis not present

## 2018-09-02 DIAGNOSIS — Z9181 History of falling: Secondary | ICD-10-CM | POA: Diagnosis not present

## 2018-09-02 DIAGNOSIS — R200 Anesthesia of skin: Secondary | ICD-10-CM | POA: Diagnosis not present

## 2018-09-02 DIAGNOSIS — Z8546 Personal history of malignant neoplasm of prostate: Secondary | ICD-10-CM | POA: Diagnosis not present

## 2018-09-04 DIAGNOSIS — Z602 Problems related to living alone: Secondary | ICD-10-CM | POA: Diagnosis not present

## 2018-09-04 DIAGNOSIS — Z7982 Long term (current) use of aspirin: Secondary | ICD-10-CM | POA: Diagnosis not present

## 2018-09-04 DIAGNOSIS — G540 Brachial plexus disorders: Secondary | ICD-10-CM | POA: Diagnosis not present

## 2018-09-04 DIAGNOSIS — Z8546 Personal history of malignant neoplasm of prostate: Secondary | ICD-10-CM | POA: Diagnosis not present

## 2018-09-04 DIAGNOSIS — S43005D Unspecified dislocation of left shoulder joint, subsequent encounter: Secondary | ICD-10-CM | POA: Diagnosis not present

## 2018-09-04 DIAGNOSIS — R200 Anesthesia of skin: Secondary | ICD-10-CM | POA: Diagnosis not present

## 2018-09-04 DIAGNOSIS — Z7901 Long term (current) use of anticoagulants: Secondary | ICD-10-CM | POA: Diagnosis not present

## 2018-09-04 DIAGNOSIS — Z9181 History of falling: Secondary | ICD-10-CM | POA: Diagnosis not present

## 2018-09-04 DIAGNOSIS — I4891 Unspecified atrial fibrillation: Secondary | ICD-10-CM | POA: Diagnosis not present

## 2018-09-10 DIAGNOSIS — I4891 Unspecified atrial fibrillation: Secondary | ICD-10-CM | POA: Diagnosis not present

## 2018-09-10 DIAGNOSIS — Z7982 Long term (current) use of aspirin: Secondary | ICD-10-CM | POA: Diagnosis not present

## 2018-09-10 DIAGNOSIS — Z7901 Long term (current) use of anticoagulants: Secondary | ICD-10-CM | POA: Diagnosis not present

## 2018-09-10 DIAGNOSIS — Z9181 History of falling: Secondary | ICD-10-CM | POA: Diagnosis not present

## 2018-09-10 DIAGNOSIS — R200 Anesthesia of skin: Secondary | ICD-10-CM | POA: Diagnosis not present

## 2018-09-10 DIAGNOSIS — S43005D Unspecified dislocation of left shoulder joint, subsequent encounter: Secondary | ICD-10-CM | POA: Diagnosis not present

## 2018-09-10 DIAGNOSIS — Z8546 Personal history of malignant neoplasm of prostate: Secondary | ICD-10-CM | POA: Diagnosis not present

## 2018-09-10 DIAGNOSIS — G540 Brachial plexus disorders: Secondary | ICD-10-CM | POA: Diagnosis not present

## 2018-09-10 DIAGNOSIS — Z602 Problems related to living alone: Secondary | ICD-10-CM | POA: Diagnosis not present

## 2018-09-16 DIAGNOSIS — G540 Brachial plexus disorders: Secondary | ICD-10-CM | POA: Diagnosis not present

## 2018-09-16 DIAGNOSIS — Z7982 Long term (current) use of aspirin: Secondary | ICD-10-CM | POA: Diagnosis not present

## 2018-09-16 DIAGNOSIS — Z9181 History of falling: Secondary | ICD-10-CM | POA: Diagnosis not present

## 2018-09-16 DIAGNOSIS — R200 Anesthesia of skin: Secondary | ICD-10-CM | POA: Diagnosis not present

## 2018-09-16 DIAGNOSIS — Z7901 Long term (current) use of anticoagulants: Secondary | ICD-10-CM | POA: Diagnosis not present

## 2018-09-16 DIAGNOSIS — Z602 Problems related to living alone: Secondary | ICD-10-CM | POA: Diagnosis not present

## 2018-09-16 DIAGNOSIS — Z8546 Personal history of malignant neoplasm of prostate: Secondary | ICD-10-CM | POA: Diagnosis not present

## 2018-09-16 DIAGNOSIS — I4891 Unspecified atrial fibrillation: Secondary | ICD-10-CM | POA: Diagnosis not present

## 2018-09-16 DIAGNOSIS — S43005D Unspecified dislocation of left shoulder joint, subsequent encounter: Secondary | ICD-10-CM | POA: Diagnosis not present

## 2018-09-22 DIAGNOSIS — Z7982 Long term (current) use of aspirin: Secondary | ICD-10-CM | POA: Diagnosis not present

## 2018-09-22 DIAGNOSIS — S43005D Unspecified dislocation of left shoulder joint, subsequent encounter: Secondary | ICD-10-CM | POA: Diagnosis not present

## 2018-09-22 DIAGNOSIS — Z8546 Personal history of malignant neoplasm of prostate: Secondary | ICD-10-CM | POA: Diagnosis not present

## 2018-09-22 DIAGNOSIS — Z602 Problems related to living alone: Secondary | ICD-10-CM | POA: Diagnosis not present

## 2018-09-22 DIAGNOSIS — Z7901 Long term (current) use of anticoagulants: Secondary | ICD-10-CM | POA: Diagnosis not present

## 2018-09-22 DIAGNOSIS — G540 Brachial plexus disorders: Secondary | ICD-10-CM | POA: Diagnosis not present

## 2018-09-22 DIAGNOSIS — I4891 Unspecified atrial fibrillation: Secondary | ICD-10-CM | POA: Diagnosis not present

## 2018-09-22 DIAGNOSIS — Z9181 History of falling: Secondary | ICD-10-CM | POA: Diagnosis not present

## 2018-09-22 DIAGNOSIS — R200 Anesthesia of skin: Secondary | ICD-10-CM | POA: Diagnosis not present

## 2018-09-25 DIAGNOSIS — G540 Brachial plexus disorders: Secondary | ICD-10-CM | POA: Diagnosis not present

## 2018-09-25 DIAGNOSIS — R200 Anesthesia of skin: Secondary | ICD-10-CM | POA: Diagnosis not present

## 2018-09-25 DIAGNOSIS — I4891 Unspecified atrial fibrillation: Secondary | ICD-10-CM | POA: Diagnosis not present

## 2018-09-25 DIAGNOSIS — Z9181 History of falling: Secondary | ICD-10-CM | POA: Diagnosis not present

## 2018-09-25 DIAGNOSIS — S43005D Unspecified dislocation of left shoulder joint, subsequent encounter: Secondary | ICD-10-CM | POA: Diagnosis not present

## 2018-09-25 DIAGNOSIS — Z8546 Personal history of malignant neoplasm of prostate: Secondary | ICD-10-CM | POA: Diagnosis not present

## 2018-09-25 DIAGNOSIS — Z602 Problems related to living alone: Secondary | ICD-10-CM | POA: Diagnosis not present

## 2018-09-25 DIAGNOSIS — Z7901 Long term (current) use of anticoagulants: Secondary | ICD-10-CM | POA: Diagnosis not present

## 2018-09-25 DIAGNOSIS — Z7982 Long term (current) use of aspirin: Secondary | ICD-10-CM | POA: Diagnosis not present

## 2018-09-28 DIAGNOSIS — Z602 Problems related to living alone: Secondary | ICD-10-CM | POA: Diagnosis not present

## 2018-09-28 DIAGNOSIS — Z7901 Long term (current) use of anticoagulants: Secondary | ICD-10-CM | POA: Diagnosis not present

## 2018-09-28 DIAGNOSIS — G540 Brachial plexus disorders: Secondary | ICD-10-CM | POA: Diagnosis not present

## 2018-09-28 DIAGNOSIS — Z8546 Personal history of malignant neoplasm of prostate: Secondary | ICD-10-CM | POA: Diagnosis not present

## 2018-09-28 DIAGNOSIS — Z7982 Long term (current) use of aspirin: Secondary | ICD-10-CM | POA: Diagnosis not present

## 2018-09-28 DIAGNOSIS — S43005D Unspecified dislocation of left shoulder joint, subsequent encounter: Secondary | ICD-10-CM | POA: Diagnosis not present

## 2018-09-28 DIAGNOSIS — Z9181 History of falling: Secondary | ICD-10-CM | POA: Diagnosis not present

## 2018-09-28 DIAGNOSIS — R200 Anesthesia of skin: Secondary | ICD-10-CM | POA: Diagnosis not present

## 2018-09-28 DIAGNOSIS — I4891 Unspecified atrial fibrillation: Secondary | ICD-10-CM | POA: Diagnosis not present

## 2018-12-24 DIAGNOSIS — C61 Malignant neoplasm of prostate: Secondary | ICD-10-CM | POA: Diagnosis not present

## 2018-12-24 DIAGNOSIS — R339 Retention of urine, unspecified: Secondary | ICD-10-CM | POA: Diagnosis not present

## 2019-01-28 DIAGNOSIS — I4891 Unspecified atrial fibrillation: Secondary | ICD-10-CM | POA: Diagnosis not present

## 2019-01-28 DIAGNOSIS — R269 Unspecified abnormalities of gait and mobility: Secondary | ICD-10-CM | POA: Diagnosis not present

## 2019-01-28 DIAGNOSIS — G540 Brachial plexus disorders: Secondary | ICD-10-CM | POA: Diagnosis not present

## 2019-01-28 DIAGNOSIS — R600 Localized edema: Secondary | ICD-10-CM | POA: Diagnosis not present

## 2019-01-28 DIAGNOSIS — R3911 Hesitancy of micturition: Secondary | ICD-10-CM | POA: Diagnosis not present

## 2019-02-02 DIAGNOSIS — I4891 Unspecified atrial fibrillation: Secondary | ICD-10-CM | POA: Diagnosis not present

## 2019-02-02 DIAGNOSIS — Z6822 Body mass index (BMI) 22.0-22.9, adult: Secondary | ICD-10-CM | POA: Diagnosis not present

## 2019-02-02 DIAGNOSIS — R600 Localized edema: Secondary | ICD-10-CM | POA: Diagnosis not present

## 2019-02-02 DIAGNOSIS — L97919 Non-pressure chronic ulcer of unspecified part of right lower leg with unspecified severity: Secondary | ICD-10-CM | POA: Diagnosis not present

## 2019-05-12 DIAGNOSIS — I480 Paroxysmal atrial fibrillation: Secondary | ICD-10-CM | POA: Diagnosis not present

## 2019-05-12 DIAGNOSIS — R269 Unspecified abnormalities of gait and mobility: Secondary | ICD-10-CM | POA: Diagnosis not present

## 2019-05-12 DIAGNOSIS — G540 Brachial plexus disorders: Secondary | ICD-10-CM | POA: Diagnosis not present

## 2019-05-12 DIAGNOSIS — R600 Localized edema: Secondary | ICD-10-CM | POA: Diagnosis not present

## 2019-05-12 DIAGNOSIS — Z6822 Body mass index (BMI) 22.0-22.9, adult: Secondary | ICD-10-CM | POA: Diagnosis not present

## 2019-05-12 DIAGNOSIS — Z79899 Other long term (current) drug therapy: Secondary | ICD-10-CM | POA: Diagnosis not present

## 2019-05-12 DIAGNOSIS — I4891 Unspecified atrial fibrillation: Secondary | ICD-10-CM | POA: Diagnosis not present

## 2019-05-12 DIAGNOSIS — R3911 Hesitancy of micturition: Secondary | ICD-10-CM | POA: Diagnosis not present

## 2019-06-21 DIAGNOSIS — Z0000 Encounter for general adult medical examination without abnormal findings: Secondary | ICD-10-CM | POA: Diagnosis not present

## 2019-06-21 DIAGNOSIS — Z1331 Encounter for screening for depression: Secondary | ICD-10-CM | POA: Diagnosis not present

## 2019-06-21 DIAGNOSIS — Z9181 History of falling: Secondary | ICD-10-CM | POA: Diagnosis not present

## 2019-06-23 ENCOUNTER — Encounter (HOSPITAL_COMMUNITY): Payer: Self-pay

## 2019-06-23 ENCOUNTER — Emergency Department (HOSPITAL_COMMUNITY): Payer: Medicare HMO

## 2019-06-23 ENCOUNTER — Other Ambulatory Visit: Payer: Self-pay

## 2019-06-23 ENCOUNTER — Inpatient Hospital Stay (HOSPITAL_COMMUNITY)
Admission: EM | Admit: 2019-06-23 | Discharge: 2019-06-25 | DRG: 309 | Disposition: A | Discharge status: Home / Self Care | Payer: Medicare HMO | Attending: Family Medicine | Admitting: Family Medicine

## 2019-06-23 DIAGNOSIS — G9340 Encephalopathy, unspecified: Secondary | ICD-10-CM | POA: Diagnosis present

## 2019-06-23 DIAGNOSIS — R404 Transient alteration of awareness: Secondary | ICD-10-CM | POA: Diagnosis not present

## 2019-06-23 DIAGNOSIS — Z7982 Long term (current) use of aspirin: Secondary | ICD-10-CM | POA: Diagnosis not present

## 2019-06-23 DIAGNOSIS — G8929 Other chronic pain: Secondary | ICD-10-CM | POA: Diagnosis present

## 2019-06-23 DIAGNOSIS — D696 Thrombocytopenia, unspecified: Secondary | ICD-10-CM | POA: Diagnosis present

## 2019-06-23 DIAGNOSIS — I959 Hypotension, unspecified: Secondary | ICD-10-CM | POA: Diagnosis not present

## 2019-06-23 DIAGNOSIS — Z79891 Long term (current) use of opiate analgesic: Secondary | ICD-10-CM | POA: Diagnosis not present

## 2019-06-23 DIAGNOSIS — I248 Other forms of acute ischemic heart disease: Secondary | ICD-10-CM | POA: Diagnosis present

## 2019-06-23 DIAGNOSIS — I6603 Occlusion and stenosis of bilateral middle cerebral arteries: Secondary | ICD-10-CM | POA: Diagnosis not present

## 2019-06-23 DIAGNOSIS — I480 Paroxysmal atrial fibrillation: Secondary | ICD-10-CM | POA: Diagnosis not present

## 2019-06-23 DIAGNOSIS — R29818 Other symptoms and signs involving the nervous system: Secondary | ICD-10-CM | POA: Diagnosis not present

## 2019-06-23 DIAGNOSIS — Z79899 Other long term (current) drug therapy: Secondary | ICD-10-CM

## 2019-06-23 DIAGNOSIS — R1111 Vomiting without nausea: Secondary | ICD-10-CM | POA: Diagnosis not present

## 2019-06-23 DIAGNOSIS — R55 Syncope and collapse: Secondary | ICD-10-CM | POA: Diagnosis present

## 2019-06-23 DIAGNOSIS — N179 Acute kidney failure, unspecified: Secondary | ICD-10-CM | POA: Diagnosis present

## 2019-06-23 DIAGNOSIS — R4182 Altered mental status, unspecified: Secondary | ICD-10-CM | POA: Diagnosis not present

## 2019-06-23 DIAGNOSIS — Z20828 Contact with and (suspected) exposure to other viral communicable diseases: Secondary | ICD-10-CM | POA: Diagnosis present

## 2019-06-23 DIAGNOSIS — R569 Unspecified convulsions: Secondary | ICD-10-CM | POA: Diagnosis not present

## 2019-06-23 DIAGNOSIS — R9431 Abnormal electrocardiogram [ECG] [EKG]: Secondary | ICD-10-CM

## 2019-06-23 DIAGNOSIS — G459 Transient cerebral ischemic attack, unspecified: Secondary | ICD-10-CM | POA: Diagnosis present

## 2019-06-23 DIAGNOSIS — R4189 Other symptoms and signs involving cognitive functions and awareness: Secondary | ICD-10-CM | POA: Diagnosis not present

## 2019-06-23 DIAGNOSIS — N183 Chronic kidney disease, stage 3 (moderate): Secondary | ICD-10-CM | POA: Diagnosis present

## 2019-06-23 DIAGNOSIS — C61 Malignant neoplasm of prostate: Secondary | ICD-10-CM | POA: Diagnosis present

## 2019-06-23 DIAGNOSIS — R1310 Dysphagia, unspecified: Secondary | ICD-10-CM | POA: Diagnosis present

## 2019-06-23 DIAGNOSIS — Z96652 Presence of left artificial knee joint: Secondary | ICD-10-CM | POA: Diagnosis present

## 2019-06-23 DIAGNOSIS — Z8546 Personal history of malignant neoplasm of prostate: Secondary | ICD-10-CM

## 2019-06-23 DIAGNOSIS — R531 Weakness: Secondary | ICD-10-CM | POA: Diagnosis not present

## 2019-06-23 DIAGNOSIS — I4891 Unspecified atrial fibrillation: Secondary | ICD-10-CM | POA: Diagnosis present

## 2019-06-23 DIAGNOSIS — R000 Tachycardia, unspecified: Secondary | ICD-10-CM | POA: Diagnosis not present

## 2019-06-23 LAB — CBC
HCT: 48.4 % (ref 39.0–52.0)
Hemoglobin: 15.6 g/dL (ref 13.0–17.0)
MCH: 33.8 pg (ref 26.0–34.0)
MCHC: 32.2 g/dL (ref 30.0–36.0)
MCV: 105 fL — ABNORMAL HIGH (ref 80.0–100.0)
Platelets: 139 10*3/uL — ABNORMAL LOW (ref 150–400)
RBC: 4.61 MIL/uL (ref 4.22–5.81)
RDW: 13.6 % (ref 11.5–15.5)
WBC: 6.3 10*3/uL (ref 4.0–10.5)
nRBC: 0 % (ref 0.0–0.2)

## 2019-06-23 LAB — PROTIME-INR
INR: 1.1 (ref 0.8–1.2)
Prothrombin Time: 13.7 seconds (ref 11.4–15.2)

## 2019-06-23 LAB — I-STAT CHEM 8, ED
BUN: 30 mg/dL — ABNORMAL HIGH (ref 8–23)
Calcium, Ion: 0.95 mmol/L — ABNORMAL LOW (ref 1.15–1.40)
Chloride: 112 mmol/L — ABNORMAL HIGH (ref 98–111)
Creatinine, Ser: 1.4 mg/dL — ABNORMAL HIGH (ref 0.61–1.24)
Glucose, Bld: 127 mg/dL — ABNORMAL HIGH (ref 70–99)
HCT: 48 % (ref 39.0–52.0)
Hemoglobin: 16.3 g/dL (ref 13.0–17.0)
Potassium: 3.6 mmol/L (ref 3.5–5.1)
Sodium: 140 mmol/L (ref 135–145)
TCO2: 21 mmol/L — ABNORMAL LOW (ref 22–32)

## 2019-06-23 LAB — DIFFERENTIAL
Abs Immature Granulocytes: 0.02 10*3/uL (ref 0.00–0.07)
Basophils Absolute: 0 10*3/uL (ref 0.0–0.1)
Basophils Relative: 1 %
Eosinophils Absolute: 0.2 10*3/uL (ref 0.0–0.5)
Eosinophils Relative: 3 %
Immature Granulocytes: 0 %
Lymphocytes Relative: 54 %
Lymphs Abs: 3.4 10*3/uL (ref 0.7–4.0)
Monocytes Absolute: 0.5 10*3/uL (ref 0.1–1.0)
Monocytes Relative: 8 %
Neutro Abs: 2.2 10*3/uL (ref 1.7–7.7)
Neutrophils Relative %: 34 %

## 2019-06-23 LAB — COMPREHENSIVE METABOLIC PANEL
ALT: 11 U/L (ref 0–44)
AST: 18 U/L (ref 15–41)
Albumin: 3.2 g/dL — ABNORMAL LOW (ref 3.5–5.0)
Alkaline Phosphatase: 70 U/L (ref 38–126)
Anion gap: 14 (ref 5–15)
BUN: 24 mg/dL — ABNORMAL HIGH (ref 8–23)
CO2: 18 mmol/L — ABNORMAL LOW (ref 22–32)
Calcium: 9.1 mg/dL (ref 8.9–10.3)
Chloride: 109 mmol/L (ref 98–111)
Creatinine, Ser: 1.56 mg/dL — ABNORMAL HIGH (ref 0.61–1.24)
GFR calc Af Amer: 42 mL/min — ABNORMAL LOW (ref >60)
GFR calc non Af Amer: 36 mL/min — ABNORMAL LOW (ref >60)
Glucose, Bld: 129 mg/dL — ABNORMAL HIGH (ref 70–99)
Potassium: 3.6 mmol/L (ref 3.5–5.1)
Sodium: 141 mmol/L (ref 135–145)
Total Bilirubin: 0.4 mg/dL (ref 0.3–1.2)
Total Protein: 6.6 g/dL (ref 6.5–8.1)

## 2019-06-23 LAB — URINALYSIS, ROUTINE W REFLEX MICROSCOPIC
Bilirubin Urine: NEGATIVE
Glucose, UA: NEGATIVE mg/dL
Ketones, ur: 5 mg/dL — AB
Leukocytes,Ua: NEGATIVE
Nitrite: NEGATIVE
Protein, ur: NEGATIVE mg/dL
Specific Gravity, Urine: 1.014 (ref 1.005–1.030)
pH: 5 (ref 5.0–8.0)

## 2019-06-23 LAB — ETHANOL: Alcohol, Ethyl (B): <10 mg/dL (ref <10)

## 2019-06-23 LAB — APTT: aPTT: 25 seconds (ref 24–36)

## 2019-06-23 LAB — TROPONIN I (HIGH SENSITIVITY): Troponin I (High Sensitivity): 62 ng/L — ABNORMAL HIGH (ref <18)

## 2019-06-23 MED ORDER — AMIODARONE IV BOLUS ONLY 150 MG/100ML: 150.0000 mg | Freq: Once | INTRAVENOUS | Status: DC

## 2019-06-23 MED ORDER — ACETAMINOPHEN 650 MG RE SUPP: 650.0000 mg | Freq: Four times a day (QID) | RECTAL | Status: DC | PRN

## 2019-06-23 MED ORDER — PROCHLORPERAZINE EDISYLATE 10 MG/2ML IJ SOLN: 10.0000 mg | Freq: Four times a day (QID) | INTRAMUSCULAR | Status: DC | PRN

## 2019-06-23 MED ORDER — DILTIAZEM HCL 25 MG/5ML IV SOLN
10.0000 mg | Freq: Once | INTRAVENOUS | Status: AC
  Administered 2019-06-23: 21:00:00 10 mg via INTRAVENOUS
  Filled 2019-06-23 (×2): qty 5

## 2019-06-23 MED ORDER — SODIUM CHLORIDE 0.9 % IV SOLN
INTRAVENOUS | Status: AC
  Administered 2019-06-23: 23:00:00 via INTRAVENOUS

## 2019-06-23 MED ORDER — HYDROCODONE-ACETAMINOPHEN 5-325 MG PO TABS: 1.0000 | ORAL_TABLET | Freq: Four times a day (QID) | ORAL | Status: DC | PRN

## 2019-06-23 MED ORDER — BETHANECHOL CHLORIDE 25 MG PO TABS
25.0000 mg | ORAL_TABLET | Freq: Two times a day (BID) | ORAL | Status: DC
  Administered 2019-06-24 – 2019-06-25 (×3): 25 mg via ORAL
  Filled 2019-06-23 (×5): qty 1

## 2019-06-23 MED ORDER — DILTIAZEM HCL-DEXTROSE 100-5 MG/100ML-% IV SOLN (PREMIX)
5.0000 mg/h | INTRAVENOUS | Status: DC
  Filled 2019-06-23: qty 100

## 2019-06-23 MED ORDER — HEPARIN (PORCINE) 25000 UT/250ML-% IV SOLN
850.0000 [IU]/h | INTRAVENOUS | Status: DC
  Administered 2019-06-23: 23:00:00 900 [IU]/h via INTRAVENOUS
  Administered 2019-06-24: 19:00:00 800 [IU]/h via INTRAVENOUS
  Filled 2019-06-23 (×2): qty 250

## 2019-06-23 MED ORDER — HEPARIN BOLUS VIA INFUSION
3500.0000 [IU] | Freq: Once | INTRAVENOUS | Status: AC
  Administered 2019-06-23: 23:00:00 3500 [IU] via INTRAVENOUS
  Filled 2019-06-23: qty 3500

## 2019-06-23 MED ORDER — ASPIRIN EC 81 MG PO TBEC
81.0000 mg | DELAYED_RELEASE_TABLET | Freq: Every day | ORAL | Status: DC
  Administered 2019-06-24 – 2019-06-25 (×2): 81 mg via ORAL
  Filled 2019-06-23 (×2): qty 1

## 2019-06-23 MED ORDER — TAMSULOSIN HCL 0.4 MG PO CAPS
0.4000 mg | ORAL_CAPSULE | Freq: Two times a day (BID) | ORAL | Status: DC
  Administered 2019-06-24 – 2019-06-25 (×3): 0.4 mg via ORAL
  Filled 2019-06-23 (×3): qty 1

## 2019-06-23 MED ORDER — AMIODARONE IV BOLUS ONLY 150 MG/100ML
150.0000 mg | Freq: Once | INTRAVENOUS | Status: AC
  Administered 2019-06-23: 150 mg via INTRAVENOUS

## 2019-06-23 MED ORDER — DILTIAZEM HCL 25 MG/5ML IV SOLN
5.0000 mg | Freq: Once | INTRAVENOUS | Status: AC
  Administered 2019-06-23: 19:00:00 5 mg via INTRAVENOUS
  Filled 2019-06-23: qty 5

## 2019-06-23 MED ORDER — AMIODARONE HCL IN DEXTROSE 360-4.14 MG/200ML-% IV SOLN
60.0000 mg/h | INTRAVENOUS | Status: DC
  Administered 2019-06-23 (×2): 60 mg/h via INTRAVENOUS
  Filled 2019-06-23 (×2): qty 200

## 2019-06-23 MED ORDER — AMIODARONE HCL IN DEXTROSE 360-4.14 MG/200ML-% IV SOLN
30.0000 mg/h | INTRAVENOUS | Status: DC
  Filled 2019-06-23: qty 200

## 2019-06-23 MED ORDER — ACETAMINOPHEN 325 MG PO TABS: 650.0000 mg | ORAL_TABLET | Freq: Four times a day (QID) | ORAL | Status: DC | PRN

## 2019-06-23 MED ORDER — AMIODARONE LOAD VIA INFUSION
150.0000 mg | Freq: Once | INTRAVENOUS | Status: AC
  Administered 2019-06-23: 22:00:00 150 mg via INTRAVENOUS
  Filled 2019-06-23: qty 83.34

## 2019-06-23 MED ORDER — PROCHLORPERAZINE EDISYLATE 10 MG/2ML IJ SOLN: 5.0000 mg | Freq: Four times a day (QID) | INTRAMUSCULAR | Status: DC | PRN

## 2019-06-23 NOTE — ED Notes (Signed)
RN informed Dr. Francia Greaves pt. Sustained A fib RVR HR 130-150s for about 2-3 minutes for two occassions. RN to continue to monitor pt. And inform if continued

## 2019-06-23 NOTE — Consult Note (Signed)
NEURO HOSPITALIST CONSULT NOTE   Requestig physician: Dr. Francia Greaves  Reason for Consult: Unresponsiveness with left sided weakness noted by EMS  History obtained from:  EMS and Chart     HPI:                                                                                                                                          Alan Rios is an 85 y.o. male who presents via EMS after being found unresponsive at home.   Per Triage RN note: "Per [facility]: Pt was working outside, got weak and then threw up on himself. Lase seen normal was 1500. Pt had repetitive answers to questions and had some dysphagia along with difficulty following commands. Pt is in Afib with RVR, rate of 150-70 with 30 palpated, RR 16, 99% on room air, BP 120/60."  Past Medical History:  Diagnosis Date  . Arthritis   . Cancer of prostate   . Joint pain   . Joint swelling     Past Surgical History:  Procedure Laterality Date  . cataract surgery Bilateral   . TOTAL KNEE ARTHROPLASTY Left 11/14/2014   Procedure: TOTAL KNEE ARTHROPLASTY;  Surgeon: Kerin Salen, MD;  Location: [place];  Service: Orthopedics;  Laterality: Left;    No family history on file.            Social History:  reports that he has never smoked. He does not have any smokeless tobacco history on file. He reports that he does not drink alcohol or use drugs.  Allergies  Allergen Reactions  . Penicillins Hives    MEDICATIONS:                                                                                                                        ROS:  Unable to obtain due to confusion.    There were no vitals taken for this visit.   General Examination:                                                                                                       Physical Exam  HEENT-   [place]/AT Lungs: Respirations unlabored Extremities- No edema   Neurological Examination Mental Status: Awake and alert. Speech at times not intelligible but later in the exam the patient's speech was fluent. Has significant comprehension deficit versus severe HOH, but will follow simple commands. Was partially oriented. No anxiety apparent. Attempts to cooperate. [place].  Cranial Nerves: II: PERRL. Blinks to threat bilaterally.  III,IV, VI: EOMI. No ptosis V,VII: smile symmetric, facial sensation without grimace to brow ridge pressure VIII: HOH IX,X: Palate symmetric XI: Symmetric  XII: midline tongue extension Motor: Right : Upper extremity   5/5    Left:     Upper extremity   5/5  Lower extremity   4+/5    Lower extremity   5/5 Normal tone throughout; no atrophy noted Sensory: Responds to noxious x 4 Deep Tendon Reflexes: 1+ and symmetric bilateral brachioradialis, biceps and patellae Plantars: Mute bilaterally  Cerebellar: No ataxia with FNF bilaterally  Gait: Deferred   Lab Results: Basic Metabolic Panel: No results for input(s): NA, K, CL, CO2, GLUCOSE, BUN, CREATININE, CALCIUM, MG, PHOS in the last 168 hours.  CBC: No results for input(s): WBC, NEUTROABS, HGB, HCT, MCV, PLT in the last 168 hours.  Cardiac Enzymes: No results for input(s): CKTOTAL, CKMB, CKMBINDEX, TROPONINI in the last 168 hours.  Lipid Panel: No results for input(s): CHOL, TRIG, HDL, CHOLHDL, VLDL, LDLCALC in the last 168 hours.  Imaging: No results found.  Assessment: 85 year old male presenting with unresponsiveness and evidence for vomiting, with left sided weakness noted by EMS 1. Left sided weakness and unresponsiveness had resolved by the time CT was being performed in the ED. He was noted to be improving from arrival at the bridge to completion of CT. 2. CT head with no acute finding. Chronic small-vessel ischemic changes of the cerebral hemispheric white matter. 3. MRI brain: No acute finding.  Age related atrophy. Chronic small-vessel ischemic changes throughout the cerebral hemispheric white matter. White matter changes are slightly progressive since 2017. 4. Most likely etiology for the patient's presentation, given normal imaging, is TIA or unwitnessed seizure with left sided Todd's paralysis.   Recommendations: 1. TIA work up with MRA of head, carotid ultrasound, TTE and cardiac telemetry 2. EEG 3. IF EEG shows epileptogenic lesion, will start an anticonvulsant.   Electronically signed: Dr. Kerney Elbe 06/23/2019, 6:08 PM

## 2019-06-23 NOTE — H&P (Addendum)
History and Physical    Alan Rios [id number] DOB: May 12, 1919 DOA: 06/23/2019  PCP: Practice, [facility] Patient coming from: Home  Chief Complaint: Unresponsiveness  HPI: Alan Rios is a 85 y.o. male with medical history significant of arthritis, prostate cancer presenting the hospital via EMS for evaluation of unresponsiveness.  Upon EMS arrival, patient had repetitive answers to questions and had difficulty following commands.  Found to be in A. fib with RVR with rate ranging from 150-170.  Patient states he was doing yard work and then just sat down because he was not feeling good.  He then remembers waking up.  Denies any chest pain, shortness of breath, heart palpitations, or lightheadedness/dizziness.  He has no other complaints.  Denies any recent illness, fevers, or chills.  Denies abdominal pain or diarrhea.  Son at bedside states he lives very close to the patient and checks on him several times a day.  Today patient was doing yard work and his son had gone somewhere.  When the son returned he noticed that the patient was sitting on a chair with his eyes closed.  Patient opened his eyes after his son shook him and started vomiting.  He was then just staring and not responding.  Son confirms that patient has not had any recent illness.  He has not had any fevers.  He has not complained of any chest pain or shortness of breath.  He does not have a history of seizures.  ED Course: Found to be in A. fib with RVR with rate in the 150s.  Blood pressure stable.  Patient received IV Cardizem 15 mg total in the ED.  Afebrile and no leukocytosis.  Not tachypneic or hypoxic.  Platelet count 139,000, chronically low.  Blood ethanol level negative.  Bicarb 18, anion gap 14.  Blood glucose 129.  BUN 24, creatinine 1.5.  Baseline creatinine 1.0-1.1.  UDS pending.  UA pending.  High-sensitivity troponin pending.  Chest x-ray showing no active disease.  Head CT negative for acute  finding.  Brain MRI negative for acute finding.  Review of Systems:  All systems reviewed and apart from history of presenting illness, are negative.  Past Medical History:  Diagnosis Date   Arthritis    Cancer of prostate [facility])    Joint pain    Joint swelling     Past Surgical History:  Procedure Laterality Date   cataract surgery Bilateral    TOTAL KNEE ARTHROPLASTY Left 11/14/2014   Procedure: TOTAL KNEE ARTHROPLASTY;  Surgeon: Kerin Salen, MD;  Location: [place];  Service: Orthopedics;  Laterality: Left;     reports that he has never smoked. He does not have any smokeless tobacco history on file. He reports that he does not drink alcohol or use drugs.  Allergies  Allergen Reactions   Penicillins Anaphylaxis, Hives and Swelling    Did it involve swelling of the face/tongue/throat, SOB, or low BP? Unk Did it involve sudden or severe rash/hives, skin peeling, or any reaction on the inside of your mouth or nose? Unk Did you need to seek medical attention at a hospital or doctor's office? No When did it last happen? Unk If all above answers are "NO", may proceed with cephalosporin use.     History reviewed. No pertinent family history.  Prior to Admission medications   Medication Sig Start Date End Date Taking? Authorizing Provider  aspirin EC 81 MG tablet Take 81 mg by mouth daily.   Yes [provider]  bethanechol (URECHOLINE) 25 MG tablet Take 25 mg by mouth 2 (two) times daily. 04/13/19  Yes [provider]  Cyanocobalamin (VITAMIN B12 PO) Take 1 tablet by mouth daily.    Yes [provider]  tamsulosin (FLOMAX) 0.4 MG CAPS capsule Take 0.4 mg by mouth 2 (two) times daily with a meal.    Yes [provider]  aspirin EC 325 MG tablet Take 1 tablet (325 mg total) by mouth 2 (two) times daily. Patient not taking: Reported on 06/23/2019 11/14/14   Leighton Parody, PA-C  HYDROcodone-acetaminophen [facility]) 5-325 MG per tablet Take 1  tablet by mouth every 6 (six) hours as needed. Patient taking differently: Take 1 tablet by mouth every 6 (six) hours as needed (for pain).  11/14/14   Leighton Parody, PA-C  tizanidine (ZANAFLEX) 2 MG capsule Take 1 capsule (2 mg total) by mouth 3 (three) times daily. Patient not taking: Reported on 06/23/2019 11/14/14   Leighton Parody, [place]    Physical Exam: Vitals:   06/23/19 2145 06/23/19 2215 06/23/19 2300 06/23/19 2321  BP: (!) 157/105 (!) 158/107 (!) 154/132   Pulse:  (!) 156    Resp:  (!) 21 (!) 23   Temp:      TempSrc:      SpO2:  91%    Weight:    63.9 kg  Height:    5\' 9"  (1.753 m)    Physical Exam  Constitutional: He is oriented to person, place, and time. He appears well-developed and well-nourished. No distress.  HENT:  Head: Normocephalic.  Eyes: EOM are normal. Right eye exhibits no discharge. Left eye exhibits no discharge.  Neck: Neck supple.  Cardiovascular: Regular rhythm and intact distal pulses.  Tachycardic with heart rate in the 160s  Pulmonary/Chest: Effort normal and breath sounds normal. No respiratory distress. He has no wheezes. He has no rales.  Abdominal: Soft. Bowel sounds are normal. He exhibits no distension. There is no abdominal tenderness. There is no guarding.  Musculoskeletal:        General: No edema.  Neurological: He is alert and oriented to person, place, and time.  Skin: Skin is warm and dry. He is not diaphoretic.     Labs on Admission: I have personally reviewed following labs and imaging studies  CBC: Recent Labs  Lab 06/23/19 1752 06/23/19 1810  WBC 6.3  --   NEUTROABS 2.2  --   HGB 15.6 16.3  HCT 48.4 48.0  MCV 105.0*  --   PLT 139*  --    Basic Metabolic Panel: Recent Labs  Lab 06/23/19 1752 06/23/19 1810  NA 141 140  K 3.6 3.6  CL 109 112*  CO2 18*  --   GLUCOSE 129* 127*  BUN 24* 30*  CREATININE 1.56* 1.40*  CALCIUM 9.1  --    GFR: Estimated Creatinine Clearance: 26.6 mL/min (A) (by C-G formula based  on SCr of 1.4 mg/dL (H)). Liver Function Tests: Recent Labs  Lab 06/23/19 1752  AST 18  ALT 11  ALKPHOS 70  BILITOT 0.4  PROT 6.6  ALBUMIN 3.2*   No results for input(s): LIPASE, AMYLASE in the last 168 hours. No results for input(s): AMMONIA in the last 168 hours. Coagulation Profile: Recent Labs  Lab 06/23/19 1752  INR 1.1   Cardiac Enzymes: No results for input(s): CKTOTAL, CKMB, CKMBINDEX, TROPONINI in the last 168 hours. BNP (last 3 results) No results for input(s): PROBNP in the last 8760  hours. HbA1C: No results for input(s): HGBA1C in the last 72 hours. CBG: No results for input(s): GLUCAP in the last 168 hours. Lipid Profile: No results for input(s): CHOL, HDL, LDLCALC, TRIG, CHOLHDL, LDLDIRECT in the last 72 hours. Thyroid Function Tests: Recent Labs    06/23/19 2326  FREET4 1.19*   Anemia Panel: No results for input(s): VITAMINB12, FOLATE, FERRITIN, TIBC, IRON, RETICCTPCT in the last 72 hours. Urine analysis:    Component Value Date/Time   COLORURINE YELLOW 06/23/2019 2306   APPEARANCEUR CLEAR 06/23/2019 2306   LABSPEC 1.014 06/23/2019 2306   PHURINE 5.0 06/23/2019 2306   GLUCOSEU NEGATIVE 06/23/2019 2306   HGBUR SMALL (A) 06/23/2019 2306   BILIRUBINUR NEGATIVE 06/23/2019 2306   KETONESUR 5 (A) 06/23/2019 2306   PROTEINUR NEGATIVE 06/23/2019 2306   UROBILINOGEN 0.2 11/03/2014 1345   NITRITE NEGATIVE 06/23/2019 2306   LEUKOCYTESUR NEGATIVE 06/23/2019 2306    Radiological Exams on Admission: Mr Brain Wo Contrast (neuro Protocol)  Result Date: 06/23/2019 CLINICAL DATA:  Altered mental status. Weakness. Vomiting. Last seen normal 1500 hours. EXAM: MRI HEAD WITHOUT CONTRAST TECHNIQUE: Multiplanar, multiecho pulse sequences of the brain and surrounding structures were obtained without intravenous contrast. COMPARISON:  Head CT same day.  MRI 11/12/2013 FINDINGS: Brain: Diffusion imaging does not show any acute or subacute infarction. The brainstem and  cerebellum are normal. Cerebral hemispheres show confluent chronic small vessel ischemic changes of the deep and subcortical white matter. No cortical or large vessel territory infarction. No mass lesion, hemorrhage, hydrocephalus or extra-axial collection. Vascular: Major vessels at the base of the brain show flow. Skull and upper cervical spine: Negative Sinuses/Orbits: Clear/normal Other: None IMPRESSION: No acute finding. Age related atrophy. Chronic small-vessel ischemic changes throughout the cerebral hemispheric white matter. White matter changes are slightly progressive since 2017. Electronically Signed   By: Nelson Chimes M.D.   On: 06/23/2019 20:22   Dg Chest Port 1 View  Result Date: 06/23/2019 CLINICAL DATA:  Weakness EXAM: PORTABLE CHEST 1 VIEW COMPARISON:  11/12/2013 FINDINGS: Heart and mediastinal contours are within normal limits. No focal opacities or effusions. No acute bony abnormality. IMPRESSION: No active disease. Electronically Signed   By: Rolm Baptise M.D.   On: 06/23/2019 18:59   Ct Head Code Stroke Wo Contrast  Result Date: 06/23/2019 CLINICAL DATA:  Code stroke.  Unresponsive. EXAM: CT HEAD WITHOUT CONTRAST TECHNIQUE: Contiguous axial images were obtained from the base of the skull through the vertex without intravenous contrast. COMPARISON:  11/12/2018 FINDINGS: Brain: Extensive chronic small-vessel ischemic changes of the cerebral hemispheric white matter. No CT evidence of acute infarction, mass lesion, hemorrhage, hydrocephalus or extra-axial collection. Vascular: There is atherosclerotic calcification of the major vessels at the base of the brain. Skull: Negative Sinuses/Orbits: Clear/normal Other: None ASPECTS ([place] Stroke Program Early CT Score) - Ganglionic level infarction (caudate, lentiform nuclei, internal capsule, insula, M1-M3 cortex): 7 - Supraganglionic infarction (M4-M6 cortex): 3 Total score (0-10 with 10 being normal): 10 IMPRESSION: 1. No acute finding by CT.  Chronic small-vessel ischemic changes of the cerebral hemispheric white matter. 2. ASPECTS is 10 3. These results were communicated to Dr. Cheral Marker at [place] 9/23/2020by text page via the [facility] messaging system. Electronically Signed   By: Nelson Chimes M.D.   On: 06/23/2019 18:14    EKG: Independently reviewed.  A. fib with RVR, heart rate 131.  Assessment/Plan Principal Problem:   Atrial fibrillation with rapid ventricular response (HCC) Active Problems:   Syncope   QT prolongation  AKI (acute kidney injury) ([place])   Thrombocytopenia ([place])   New onset A. fib with RVR/ arrhythmia  Patient initially found to be A. fib with RVR with rate ranging from 150-170.  He received a total of 15 mg IV Cardizem in the ED but continues to be significantly tachycardic with rate persistently in the 150s-160s.  Rhythm subsequently ranging between SVT, sinus tachycardia, and slightly wide QRS complex tachycardia in the ED.  Blood pressure stable. -Monitor very closely in the progressive care unit -Discussed with cardiology, recommended getting IV amiodarone 150 mg bolus followed by infusion.  If rate continues to be uncontrolled, may get repeat 150 mg amiodarone bolus.  If rate still uncontrolled, add oral diltiazem 30 mg every 6 hours.  Monitor blood pressure closely and give a small fluid bolus if hypotensive. -CHA2DS2VASc at least 2. Heparin infusion for anticoagulation given initial rhythm was A. Fib. -Echocardiogram -TSH, free T4 -UDS pending  Syncope Suspect related to arrhythmia.  High-sensitivity troponin mildly elevated at 62.  EKG not suggestive of ACS.  Mild troponin elevation likely secondary to demand ischemia.  Head CT negative for acute finding.  Brain MRI negative for acute finding.  No focal neuro deficit on exam. -Patient was seen by neurology, recommendations pending -Cardiac monitoring -Continue to trend troponin -Management of arrhythmia as mentioned above -Echocardiogram  QTC  prolongation on EKG Repeat EKG with QTC 525. -Cardiac monitoring -Give IV magnesium 2 g -Keep potassium above 4 and magnesium above 2 -Avoid QT prolonging drugs if possible.  Currently on amiodarone per cardiology recommendation. -Repeat EKG in a.m.  AKI BUN 24, creatinine 1.5.  Baseline creatinine 1.0-1.1. -Gentle IV fluid hydration -Continue to monitor renal function -Monitor urine output  Chronic thrombocytopenia Platelet count 139,000, chronically low.  No signs of active bleeding. -Continue to monitor  Chronic pain -Continue home Norco PRN  DVT prophylaxis: Heparin Code Status: Full code.  Discussed with the patient and son at bedside. Family Communication: Son updated Disposition Plan: Anticipate discharge after clinical improvement. Consults called: Cardiology (Dr. Paticia Stack) Admission status: It is my clinical opinion that referral for OBSERVATION is reasonable and necessary in this patient based on the above information provided. The aforementioned taken together are felt to place the patient at high risk for further clinical deterioration. However it is anticipated that the patient may be medically stable for discharge from the hospital within 24 to 48 hours.  The medical decision making on this patient was of high complexity and the patient is at high risk for clinical deterioration, therefore this is a level 3 visit.  Shela Leff MD [facility] Pager [phone]  If 7PM-7AM, please contact night-coverage [email or web address] Password [facility]  06/24/2019, 12:56 AM

## 2019-06-23 NOTE — ED Triage Notes (Signed)
Per [facility]: Pt was working outside, got weak and then threw up on himself. Lase seen normal was 1500. Pt had repetitive answers to questions and had some dysphagia along with difficulty following commands. Pt is in Afib with RVR, rate of 150-70 with 30 palpated, RR 16, 99% on room air, BP 120/60.   Pts son Demeco Yagi [id number]

## 2019-06-23 NOTE — ED Notes (Signed)
X-ray at bedside

## 2019-06-23 NOTE — ED Provider Notes (Addendum)
[facility] EMERGENCY DEPARTMENT Provider Note   CSN: [id number] Arrival date & time: 06/23/19  1751  An emergency department physician performed an initial assessment on this suspected stroke patient at 1754.  History   Chief Complaint Chief Complaint  Patient presents with  . Code Stroke    HPI Alan Rios is a 85 y.o. male.     85 year old male with prior medical history as detailed below presents for evaluation following reported transient alteration in his consciousness.  Patient's son witnessed an event earlier today where the patient became minimally responsive.  He apparently was confused following this event.  He did vomit.  He had been working outside just prior to the event.  EMS reports that the patient was confused and had repetitive answers to questioning.  Code stroke was initiated on arrival.  Neurology has already assessed this patient.  They do not feel that he is a candidate for TPA.  Upon the time of my evaluation the patient is comfortable.  He has returned to his baseline mental status.  He denies associated chest pain, shortness of breath, current nausea, focal weakness, or other specific complaint.  He denies prior history of CAD and/or atrial fibrillation.  The history is provided by the patient and medical records.  Illness Location:  Transient alteration in level of consciousness, weakness Severity:  Moderate Onset quality:  Unable to specify Duration:  2 hours Timing:  Rare Progression:  Resolved Chronicity:  New Associated symptoms: no chest pain, no fever and no shortness of breath     Past Medical History:  Diagnosis Date  . Arthritis   . Cancer of prostate ([place])   . Joint pain   . Joint swelling     Patient Active Problem List   Diagnosis Date Noted  . Atrial fibrillation with rapid ventricular response ([place]) 06/23/2019  . Arthritis of left knee 11/14/2014    Past Surgical History:  Procedure Laterality Date  .  cataract surgery Bilateral   . TOTAL KNEE ARTHROPLASTY Left 11/14/2014   Procedure: TOTAL KNEE ARTHROPLASTY;  Surgeon: Kerin Salen, MD;  Location: [place] [facility] [place];  Service: Orthopedics;  Laterality: Left;        Home Medications    Prior to Admission medications   Medication Sig Start Date End Date Taking? Authorizing Provider  aspirin EC 81 MG tablet Take 81 mg by mouth daily.   Yes [provider]  bethanechol (URECHOLINE) 25 MG tablet Take 25 mg by mouth 2 (two) times daily. 04/13/19  Yes [provider]  Cyanocobalamin (VITAMIN B12 PO) Take 1 tablet by mouth daily.    Yes [provider]  tamsulosin (FLOMAX) 0.4 MG CAPS capsule Take 0.4 mg by mouth 2 (two) times daily with a meal.    Yes [provider]  aspirin EC 325 MG tablet Take 1 tablet (325 mg total) by mouth 2 (two) times daily. Patient not taking: Reported on 06/23/2019 11/14/14   Leighton Parody, PA-C  HYDROcodone-acetaminophen [facility]) 5-325 MG per tablet Take 1 tablet by mouth every 6 (six) hours as needed. Patient taking differently: Take 1 tablet by mouth every 6 (six) hours as needed (for pain).  11/14/14   Leighton Parody, PA-C  tizanidine (ZANAFLEX) 2 MG capsule Take 1 capsule (2 mg total) by mouth 3 (three) times daily. Patient not taking: Reported on 06/23/2019 11/14/14   Leighton Parody, PA-C    Family History No family history on file.  Social History Social  History   Tobacco Use  . Smoking status: Never Smoker  Substance Use Topics  . Alcohol use: No  . Drug use: No     Allergies   Penicillins   Review of Systems Review of Systems  Constitutional: Negative for fever.  Respiratory: Negative for shortness of breath.   Cardiovascular: Negative for chest pain.  All other systems reviewed and are negative.    Physical Exam Updated Vital Signs BP (!) 146/95   Pulse (!) 152   Temp (!) 97.3 F (36.3 C) (Oral)   Resp 16   Wt 64.7 kg   SpO2 94%   BMI 21.06 kg/m    Physical Exam Vitals signs and nursing note reviewed.  Constitutional:      General: He is not in acute distress.    Appearance: Normal appearance. He is well-developed.  HENT:     Head: Normocephalic and atraumatic.  Eyes:     Conjunctiva/sclera: Conjunctivae normal.     Pupils: Pupils are equal, round, and reactive to light.  Neck:     Musculoskeletal: Normal range of motion and neck supple.  Cardiovascular:     Rate and Rhythm: Normal rate and regular rhythm.     Heart sounds: Normal heart sounds.  Pulmonary:     Effort: Pulmonary effort is normal. No respiratory distress.     Breath sounds: Normal breath sounds.  Abdominal:     General: There is no distension.     Palpations: Abdomen is soft.     Tenderness: There is no abdominal tenderness.  Musculoskeletal: Normal range of motion.        General: No deformity.  Skin:    General: Skin is warm and dry.  Neurological:     General: No focal deficit present.     Mental Status: He is alert and oriented to person, place, and time. Mental status is at baseline.     Cranial Nerves: No cranial nerve deficit.     Sensory: No sensory deficit.     Motor: No weakness.     Coordination: Coordination normal.      ED Treatments / Results  Labs (all labs ordered are listed, but only abnormal results are displayed) Labs Reviewed  CBC - Abnormal; Notable for the following components:      Result Value   MCV 105.0 (*)    Platelets 139 (*)    All other components within normal limits  COMPREHENSIVE METABOLIC PANEL - Abnormal; Notable for the following components:   CO2 18 (*)    Glucose, Bld 129 (*)    BUN 24 (*)    Creatinine, Ser 1.56 (*)    Albumin 3.2 (*)    GFR calc non Af Amer 36 (*)    GFR calc Af Amer 42 (*)    All other components within normal limits  I-STAT CHEM 8, ED - Abnormal; Notable for the following components:   Chloride 112 (*)    BUN 30 (*)    Creatinine, Ser 1.40 (*)    Glucose, Bld 127 (*)     Calcium, Ion 0.95 (*)    TCO2 21 (*)    All other components within normal limits  ETHANOL  PROTIME-INR  APTT  DIFFERENTIAL  RAPID URINE DRUG SCREEN, HOSP PERFORMED  URINALYSIS, ROUTINE W REFLEX MICROSCOPIC  TROPONIN I (HIGH SENSITIVITY)  TROPONIN I (HIGH SENSITIVITY)    EKG EKG Interpretation  Date/Time:  Wednesday June 23 2019 19:15:35 EDT Ventricular Rate:  152 PR Interval:  QRS Duration: 129 QT Interval:  330 QTC Calculation: 525 R Axis:   -67 Text Interpretation:  Wide-QRS tachycardia RBBB and LAFB Confirmed by Dene Gentry [phone]) on 06/23/2019 7:19:53 PM   Radiology Mr Brain Wo Contrast (neuro Protocol)  Result Date: 06/23/2019 CLINICAL DATA:  Altered mental status. Weakness. Vomiting. Last seen normal 1500 hours. EXAM: MRI HEAD WITHOUT CONTRAST TECHNIQUE: Multiplanar, multiecho pulse sequences of the brain and surrounding structures were obtained without intravenous contrast. COMPARISON:  Head CT same day.  MRI 11/12/2013 FINDINGS: Brain: Diffusion imaging does not show any acute or subacute infarction. The brainstem and cerebellum are normal. Cerebral hemispheres show confluent chronic small vessel ischemic changes of the deep and subcortical white matter. No cortical or large vessel territory infarction. No mass lesion, hemorrhage, hydrocephalus or extra-axial collection. Vascular: Major vessels at the base of the brain show flow. Skull and upper cervical spine: Negative Sinuses/Orbits: Clear/normal Other: None IMPRESSION: No acute finding. Age related atrophy. Chronic small-vessel ischemic changes throughout the cerebral hemispheric white matter. White matter changes are slightly progressive since 2017. Electronically Signed   By: Nelson Chimes M.D.   On: 06/23/2019 20:22   Dg Chest Port 1 View  Result Date: 06/23/2019 CLINICAL DATA:  Weakness EXAM: PORTABLE CHEST 1 VIEW COMPARISON:  11/12/2013 FINDINGS: Heart and mediastinal contours are within normal limits. No  focal opacities or effusions. No acute bony abnormality. IMPRESSION: No active disease. Electronically Signed   By: Rolm Baptise M.D.   On: 06/23/2019 18:59   Ct Head Code Stroke Wo Contrast  Result Date: 06/23/2019 CLINICAL DATA:  Code stroke.  Unresponsive. EXAM: CT HEAD WITHOUT CONTRAST TECHNIQUE: Contiguous axial images were obtained from the base of the skull through the vertex without intravenous contrast. COMPARISON:  11/12/2018 FINDINGS: Brain: Extensive chronic small-vessel ischemic changes of the cerebral hemispheric white matter. No CT evidence of acute infarction, mass lesion, hemorrhage, hydrocephalus or extra-axial collection. Vascular: There is atherosclerotic calcification of the major vessels at the base of the brain. Skull: Negative Sinuses/Orbits: Clear/normal Other: None ASPECTS (Carter Stroke Program Early CT Score) - Ganglionic level infarction (caudate, lentiform nuclei, internal capsule, insula, M1-M3 cortex): 7 - Supraganglionic infarction (M4-M6 cortex): 3 Total score (0-10 with 10 being normal): 10 IMPRESSION: 1. No acute finding by CT. Chronic small-vessel ischemic changes of the cerebral hemispheric white matter. 2. ASPECTS is 10 3. These results were communicated to Dr. Cheral Marker at [place] 9/23/2020by text page via the [facility] messaging system. Electronically Signed   By: Nelson Chimes M.D.   On: 06/23/2019 18:14    Procedures Procedures (including critical care time)  Medications Ordered in ED Medications  diltiazem (CARDIZEM) 100 mg in dextrose 5% 167mL (1 mg/mL) infusion (has no administration in time range)  diltiazem (CARDIZEM) injection 5 mg (5 mg Intravenous Given 06/23/19 1843)  diltiazem (CARDIZEM) injection 10 mg (10 mg Intravenous Given 06/23/19 2114)     Initial Impression / Assessment and Plan / ED Course  I have reviewed the triage vital signs and the nursing notes.  Pertinent labs & imaging results that were available during my care of the patient were  reviewed by me and considered in my medical decision making (see chart for details).      CHA2DS2/VAS Stroke Risk Points  Current as of 4 minutes ago     3 >= 2 Points: High Risk  1 - 1.99 Points: Medium Risk  0 Points: Low Risk    The patient's score has not changed in the past  year.: No Change     Details    This score determines the patient's risk of having a stroke if the  patient has atrial fibrillation.       Points Metrics  0 Has Congestive Heart Failure:  No    Current as of 4 minutes ago  0 Has Vascular Disease:  No    Current as of 4 minutes ago  1 Has Hypertension:  Yes     Current as of 4 minutes ago  2 Age:  52    Current as of 4 minutes ago  0 Has Diabetes:  No    Current as of 4 minutes ago  0 Had Stroke:  No  Had TIA:  No  Had thromboembolism:  No    Current as of 4 minutes ago  0 Male:  No    Current as of 4 minutes ago               MDM  Screen complete  Alan Rios was evaluated in Emergency Department on 06/23/2019 for the symptoms described in the history of present illness. He was evaluated in the context of the global COVID-19 pandemic, which necessitated consideration that the patient might be at risk for infection with the SARS-CoV-2 virus that causes COVID-19. Institutional protocols and algorithms that pertain to the evaluation of patients at risk for COVID-19 are in a state of rapid change based on information released by regulatory bodies including the CDC and federal and state organizations. These policies and algorithms were followed during the patient's care in the ED.   Patient is presenting for evaluation of reported transient alteration in his consciousness.  Patient is back to his mental status baseline upon arrival to the ED.  Neuro evaluation has deemed the patient to not be a candidate for TPA.  Patient was found to be in A. fib with RVR.  Cardizem drip initiated in the ED.  Medicine service is aware of case and will  evaluate the patient for admission.  Final Clinical Impressions(s) / ED Diagnoses   Final diagnoses:  Atrial fibrillation, unspecified type ([place])  Syncope, unspecified syncope type    ED Discharge Orders    None       Valarie Merino, MD 06/23/19 2133    Valarie Merino, MD 06/23/19 2140

## 2019-06-23 NOTE — Progress Notes (Signed)
ANTICOAGULATION CONSULT NOTE - Initial Consult  Pharmacy Consult for heparin Indication: atrial fibrillation  Allergies  Allergen Reactions  . Penicillins Anaphylaxis, Hives and Swelling    Did it involve swelling of the face/tongue/throat, SOB, or low BP? Unk Did it involve sudden or severe rash/hives, skin peeling, or any reaction on the inside of your mouth or nose? Unk Did you need to seek medical attention at a hospital or doctor's office? No When did it last happen? Unk If all above answers are "NO", may proceed with cephalosporin use.     Patient Measurements: Weight: 142 lb 10.2 oz (64.7 kg) Heparin Dosing Weight: 64.7kg  Vital Signs: Temp: 97.3 F (36.3 C) (09/23 1834) Temp Source: Oral (09/23 1834) BP: 157/105 (09/23 2145) Pulse Rate: 152 (09/23 2049)  Labs: Recent Labs    06/23/19 1752 06/23/19 1810 06/23/19 2055  HGB 15.6 16.3  --   HCT 48.4 48.0  --   PLT 139*  --   --   APTT 25  --   --   LABPROT 13.7  --   --   INR 1.1  --   --   CREATININE 1.56* 1.40*  --   TROPONINIHS  --   --  62*    CrCl cannot be calculated (Unknown ideal weight.).   Medical History: Past Medical History:  Diagnosis Date  . Arthritis   . Cancer of prostate ([place])   . Joint pain   . Joint swelling     Medications:  Infusions:  . amiodarone     Followed by  . [START ON 06/24/2019] amiodarone    . heparin      Assessment: 29 yom presented to the ED as a code stroke however, MRI was reported as negative for any acute findings. Found to be in afib and now starting IV heparin. Baseline Hgb is WNL and platelets are slightly low. He is not on anticoagulation PTA.   Goal of Therapy:  Heparin level 0.3-0.7 units/ml Monitor platelets by anticoagulation protocol: Yes   Plan:  Heparin bolus 3500 units IV x 1 Heparin gtt 900 units/hr Check an 8 hr heparin level Daily heparin level and CBC  Oneika Simonian, Rande Lawman 06/23/2019,10:04 PM

## 2019-06-23 NOTE — ED Notes (Signed)
ED TO INPATIENT HANDOFF REPORT  ED Nurse Name and Phone #:  [phone]  S Name/Age/Gender Alan Rios 85 y.o. male Room/Bed: 019C/019C  Code Status   Code Status: Full Code  Home/SNF/Other Home Patient oriented to: self and place Is this baseline? No   Triage Complete: Triage complete  Chief Complaint CODE STROKE  Triage Note Per [facility]: Pt was working outside, got weak and then threw up on himself. Lase seen normal was 1500. Pt had repetitive answers to questions and had some dysphagia along with difficulty following commands. Pt is in Afib with RVR, rate of 150-70 with 30 palpated, RR 16, 99% on room air, BP 120/60.   Pts son [name] [id number]    Allergies Allergies  Allergen Reactions  . Penicillins Anaphylaxis, Hives and Swelling    Did it involve swelling of the face/tongue/throat, SOB, or low BP? Unk Did it involve sudden or severe rash/hives, skin peeling, or any reaction on the inside of your mouth or nose? Unk Did you need to seek medical attention at a hospital or doctor's office? No When did it last happen? Unk If all above answers are "NO", may proceed with cephalosporin use.     Level of Care/Admitting Diagnosis ED Disposition    ED Disposition Condition Comment   Admit  Hospital Area: [facility] [[id number]]  Level of Care: Progressive [102]  I expect the patient will be discharged within 24 hours: No (not a candidate for 5C-Observation unit)  Covid Evaluation: Asymptomatic Screening Protocol (No Symptoms)  Diagnosis: Atrial fibrillation with rapid ventricular response [facility]) [id number]  Admitting Physician: Shela Leff [id number]  Attending Physician: Shela Leff [id number]  PT Class (Do Not Modify): Observation [104]  PT Acc Code (Do Not Modify): Observation [10022]       B Medical/Surgery History Past Medical History:  Diagnosis Date  . Arthritis   . Cancer of prostate ([place])   . Joint  pain   . Joint swelling    Past Surgical History:  Procedure Laterality Date  . cataract surgery Bilateral   . TOTAL KNEE ARTHROPLASTY Left 11/14/2014   Procedure: TOTAL KNEE ARTHROPLASTY;  Surgeon: Kerin Salen, MD;  Location: [facility];  Service: Orthopedics;  Laterality: Left;     A IV Location/Drains/Wounds Patient Lines/Drains/Airways Status   Active Line/Drains/Airways    Name:   Placement date:   Placement time:   Site:   Days:   Peripheral IV 06/23/19 Left Antecubital   06/23/19    -    Antecubital   less than 1   Peripheral IV 06/23/19 Right Antecubital   06/23/19    -    Antecubital   less than 1   External Urinary Catheter   06/23/19    1834    -   less than 1   Incision (Closed) 11/14/14 Knee Left   11/14/14    0810     1682          Intake/Output Last 24 hours No intake or output data in the 24 hours ending 06/23/19 2223  Labs/Imaging Results for orders placed or performed during the hospital encounter of 06/23/19 (from the past 48 hour(s))  Ethanol     Status: None   Collection Time: 06/23/19  5:52 PM  Result Value Ref Range   Alcohol, Ethyl (B) <10 <10 mg/dL    Comment: (NOTE) Lowest detectable limit for serum alcohol is 10 mg/dL. For medical purposes only. Performed at [facility]  [facility], [address], [place], [place] [address]   Protime-INR     Status: None   Collection Time: 06/23/19  5:52 PM  Result Value Ref Range   Prothrombin Time 13.7 11.4 - 15.2 seconds   INR 1.1 0.8 - 1.2    Comment: (NOTE) INR goal varies based on device and disease states. Performed at [facility], [place] [address] [place], [place] [address]   APTT     Status: None   Collection Time: 06/23/19  5:52 PM  Result Value Ref Range   aPTT 25 24 - 36 seconds    Comment: Performed at [facility] [address] [place], [place] [address]  CBC     Status: Abnormal   Collection Time: 06/23/19  5:52 PM  Result Value Ref Range   WBC 6.3 4.0 - 10.5 K/uL   RBC 4.61  4.22 - 5.81 MIL/uL   Hemoglobin 15.6 13.0 - 17.0 g/dL   HCT 48.4 39.0 - 52.0 %   MCV 105.0 (H) 80.0 - 100.0 fL   MCH 33.8 26.0 - 34.0 pg   MCHC 32.2 30.0 - 36.0 g/dL   RDW 13.6 11.5 - 15.5 %   Platelets 139 (L) 150 - 400 K/uL   nRBC 0.0 0.0 - 0.2 %    Comment: Performed at [facility], [place] [address], [place], [place] [address]  Differential     Status: None   Collection Time: 06/23/19  5:52 PM  Result Value Ref Range   Neutrophils Relative % 34 %   Neutro Abs 2.2 1.7 - 7.7 K/uL   Lymphocytes Relative 54 %   Lymphs Abs 3.4 0.7 - 4.0 K/uL   Monocytes Relative 8 %   Monocytes Absolute 0.5 0.1 - 1.0 K/uL   Eosinophils Relative 3 %   Eosinophils Absolute 0.2 0.0 - 0.5 K/uL   Basophils Relative 1 %   Basophils Absolute 0.0 0.0 - 0.1 K/uL   Immature Granulocytes 0 %   Abs Immature Granulocytes 0.02 0.00 - 0.07 K/uL    Comment: Performed at [facility], [place] [address], [place], [place] [address]  Comprehensive metabolic panel     Status: Abnormal   Collection Time: 06/23/19  5:52 PM  Result Value Ref Range   Sodium 141 135 - 145 mmol/L   Potassium 3.6 3.5 - 5.1 mmol/L   Chloride 109 98 - 111 mmol/L   CO2 18 (L) 22 - 32 mmol/L   Glucose, Bld 129 (H) 70 - 99 mg/dL   BUN 24 (H) 8 - 23 mg/dL   Creatinine, Ser 1.56 (H) 0.61 - 1.24 mg/dL   Calcium 9.1 8.9 - 10.3 mg/dL   Total Protein 6.6 6.5 - 8.1 g/dL   Albumin 3.2 (L) 3.5 - 5.0 g/dL   AST 18 15 - 41 U/L   ALT 11 0 - 44 U/L   Alkaline Phosphatase 70 38 - 126 U/L   Total Bilirubin 0.4 0.3 - 1.2 mg/dL   GFR calc non Af Amer 36 (L) >60 mL/min   GFR calc Af Amer 42 (L) >60 mL/min   Anion gap 14 5 - 15    Comment: Performed at [facility] [address] [place], [place] [address]  I-stat chem 8, ED     Status: Abnormal   Collection Time: 06/23/19  6:10 PM  Result Value Ref Range   Sodium 140 135 - 145 mmol/L   Potassium 3.6 3.5 -  5.1 mmol/L   Chloride 112 (H) 98 - 111 mmol/L   BUN 30 (H) 8 - 23 mg/dL    Creatinine, Ser 1.40 (H) 0.61 - 1.24 mg/dL   Glucose, Bld 127 (H) 70 - 99 mg/dL   Calcium, Ion 0.95 (L) 1.15 - 1.40 mmol/L   TCO2 21 (L) 22 - 32 mmol/L   Hemoglobin 16.3 13.0 - 17.0 g/dL   HCT 48.0 39.0 - 52.0 %  Troponin I (High Sensitivity)     Status: Abnormal   Collection Time: 06/23/19  8:55 PM  Result Value Ref Range   Troponin I (High Sensitivity) 62 (H) <18 ng/L    Comment: (NOTE) Elevated high sensitivity troponin I (hsTnI) values and significant  changes across serial measurements may suggest ACS but many other  chronic and acute conditions are known to elevate hsTnI results.  Refer to the "Links" section for chest pain algorithms and additional  guidance. Performed at [facility], [place] [address], [place], [place] [address]    Mr Brain Wo Contrast (neuro Protocol)  Result Date: 06/23/2019 CLINICAL DATA:  Altered mental status. Weakness. Vomiting. Last seen normal 1500 hours. EXAM: MRI HEAD WITHOUT CONTRAST TECHNIQUE: Multiplanar, multiecho pulse sequences of the brain and surrounding structures were obtained without intravenous contrast. COMPARISON:  Head CT same day.  MRI 11/12/2013 FINDINGS: Brain: Diffusion imaging does not show any acute or subacute infarction. The brainstem and cerebellum are normal. Cerebral hemispheres show confluent chronic small vessel ischemic changes of the deep and subcortical white matter. No cortical or large vessel territory infarction. No mass lesion, hemorrhage, hydrocephalus or extra-axial collection. Vascular: Major vessels at the base of the brain show flow. Skull and upper cervical spine: Negative Sinuses/Orbits: Clear/normal Other: None IMPRESSION: No acute finding. Age related atrophy. Chronic small-vessel ischemic changes throughout the cerebral hemispheric white matter. White matter changes are slightly progressive since 2017. Electronically Signed   By: Nelson Chimes M.D.   On: 06/23/2019 20:22   Dg Chest Port 1 View  Result Date:  06/23/2019 CLINICAL DATA:  Weakness EXAM: PORTABLE CHEST 1 VIEW COMPARISON:  11/12/2013 FINDINGS: Heart and mediastinal contours are within normal limits. No focal opacities or effusions. No acute bony abnormality. IMPRESSION: No active disease. Electronically Signed   By: Rolm Baptise M.D.   On: 06/23/2019 18:59   Ct Head Code Stroke Wo Contrast  Result Date: 06/23/2019 CLINICAL DATA:  Code stroke.  Unresponsive. EXAM: CT HEAD WITHOUT CONTRAST TECHNIQUE: Contiguous axial images were obtained from the base of the skull through the vertex without intravenous contrast. COMPARISON:  11/12/2018 FINDINGS: Brain: Extensive chronic small-vessel ischemic changes of the cerebral hemispheric white matter. No CT evidence of acute infarction, mass lesion, hemorrhage, hydrocephalus or extra-axial collection. Vascular: There is atherosclerotic calcification of the major vessels at the base of the brain. Skull: Negative Sinuses/Orbits: Clear/normal Other: None ASPECTS ([place] Stroke Program Early CT Score) - Ganglionic level infarction (caudate, lentiform nuclei, internal capsule, insula, M1-M3 cortex): 7 - Supraganglionic infarction (M4-M6 cortex): 3 Total score (0-10 with 10 being normal): 10 IMPRESSION: 1. No acute finding by CT. Chronic small-vessel ischemic changes of the cerebral hemispheric white matter. 2. ASPECTS is 10 3. These results were communicated to Dr. Cheral Marker at [facility] 9/23/2020by text page via the [facility] messaging system. Electronically Signed   By: Nelson Chimes M.D.   On: 06/23/2019 18:14    Pending Labs Unresulted Labs (From admission, onward)    Start     Ordered   06/25/19  0500  Heparin level (unfractionated)  Daily,   R     06/23/19 2204   06/24/19 0630  Heparin level (unfractionated)  Once-Timed,   STAT     06/23/19 2204   06/24/19 0630  CBC  Daily,   R     06/23/19 2204   06/24/19 [id number]  Basic metabolic panel  Tomorrow morning,   R     06/23/19 2222   06/23/19 2223  TSH  ONCE - STAT,    STAT     06/23/19 2222   06/23/19 2223  T4, free  ONCE - STAT,   STAT     06/23/19 2222   06/23/19 2135  SARS CORONAVIRUS 2 (TAT 6-24 HRS) Nasopharyngeal Nasopharyngeal Swab  (Asymptomatic/Tier 2 Patients Labs)  Once,   STAT    Question Answer Comment  Is this test for diagnosis or screening Screening   Symptomatic for COVID-19 as defined by CDC No   Hospitalized for COVID-19 No   Admitted to ICU for COVID-19 No   Previously tested for COVID-19 No   Resident in a congregate (group) care setting No   Employed in healthcare setting No      06/23/19 2134   06/23/19 1752  Urine rapid drug screen (hosp performed)  ONCE - STAT,   STAT     06/23/19 1752   06/23/19 1752  Urinalysis, Routine w reflex microscopic  ONCE - STAT,   STAT     06/23/19 1752          Vitals/Pain Today's Vitals   06/23/19 2049 06/23/19 2100 06/23/19 2140 06/23/19 2145  BP: (!) 146/95 (!) 137/104 (!) 137/91 (!) 157/105  Pulse: (!) 152     Resp: 16  19   Temp:      TempSrc:      SpO2: 94%     Weight:      PainSc:        Isolation Precautions No active isolations  Medications Medications  amiodarone (NEXTERONE) 1.8 mg/mL load via infusion 150 mg (150 mg Intravenous Bolus from Bag 06/23/19 2213)    Followed by  amiodarone (NEXTERONE PREMIX) 360-4.14 MG/200ML-% (1.8 mg/mL) IV infusion (60 mg/hr Intravenous New Bag/Given 06/23/19 2213)    Followed by  amiodarone (NEXTERONE PREMIX) 360-4.14 MG/200ML-% (1.8 mg/mL) IV infusion (has no administration in time range)  heparin bolus via infusion 3,500 Units (has no administration in time range)  heparin ADULT infusion 100 units/mL (25000 units/254mL sodium chloride 0.45%) (has no administration in time range)  aspirin EC tablet 81 mg (has no administration in time range)  HYDROcodone-acetaminophen (NORCO/VICODIN) 5-325 MG per tablet 1 tablet (has no administration in time range)  bethanechol (URECHOLINE) tablet 25 mg (has no administration in time range)   tamsulosin (FLOMAX) capsule 0.4 mg (has no administration in time range)  acetaminophen (TYLENOL) tablet 650 mg (has no administration in time range)    Or  acetaminophen (TYLENOL) suppository 650 mg (has no administration in time range)  0.9 %  sodium chloride infusion (has no administration in time range)  diltiazem (CARDIZEM) injection 5 mg (5 mg Intravenous Given 06/23/19 1843)  diltiazem (CARDIZEM) injection 10 mg (10 mg Intravenous Given 06/23/19 2114)    Mobility walks Moderate fall risk   Focused Assessments Cardiac Assessment Handoff:  Cardiac Rhythm: Supraventricular tachycardia No results found for: CKTOTAL, CKMB, CKMBINDEX, TROPONINI No results found for: DDIMER Does the Patient currently have chest pain? No   , Neuro Assessment Handoff:  Swallow screen pass? Yes  Cardiac Rhythm: Supraventricular tachycardia NIH Stroke Scale ( + Modified Stroke Scale Criteria)  Interval: Initial Level of Consciousness (1a.)   : Alert, keenly responsive LOC Questions (1b. )   +: Answers both questions correctly LOC Commands (1c. )   + : Performs both tasks correctly Best Gaze (2. )  +: Normal Visual (3. )  +: No visual loss Facial Palsy (4. )    : Normal symmetrical movements Motor Arm, Left (5a. )   +: No drift Motor Arm, Right (5b. )   +: No drift Motor Leg, Left (6a. )   +: No drift Motor Leg, Right (6b. )   +: No drift Limb Ataxia (7. ): Absent Sensory (8. )   +: Normal, no sensory loss Best Language (9. )   +: No aphasia Dysarthria (10. ): Normal Extinction/Inattention (11.)   +: No Abnormality Modified SS Total  +: 0 Complete NIHSS TOTAL: 0 Last date known well: 06/23/19 Last time known well: 1500 Neuro Assessment: Exceptions to WDL Neuro Checks:   Initial (06/23/19 1831)  Last Documented NIHSS Modified Score: 0 (06/23/19 1831) Has TPA been given? No If patient is a Neuro Trauma and patient is going to OR before floor call report to [facility] nurse: [phone] or  [phone]     R Recommendations: See Admitting Provider Note  Report given to:   Additional Notes: -

## 2019-06-23 NOTE — ED Notes (Signed)
Admitting at bedside 

## 2019-06-23 NOTE — ED Notes (Signed)
Patient transported to MRI 

## 2019-06-24 ENCOUNTER — Inpatient Hospital Stay (HOSPITAL_COMMUNITY): Payer: Medicare HMO

## 2019-06-24 ENCOUNTER — Encounter (HOSPITAL_COMMUNITY): Payer: Self-pay | Admitting: Internal Medicine

## 2019-06-24 ENCOUNTER — Observation Stay (HOSPITAL_COMMUNITY): Payer: Medicare HMO

## 2019-06-24 ENCOUNTER — Observation Stay (HOSPITAL_BASED_OUTPATIENT_CLINIC_OR_DEPARTMENT_OTHER): Payer: Medicare HMO

## 2019-06-24 DIAGNOSIS — Z8546 Personal history of malignant neoplasm of prostate: Secondary | ICD-10-CM | POA: Diagnosis not present

## 2019-06-24 DIAGNOSIS — C61 Malignant neoplasm of prostate: Secondary | ICD-10-CM | POA: Diagnosis present

## 2019-06-24 DIAGNOSIS — D696 Thrombocytopenia, unspecified: Secondary | ICD-10-CM

## 2019-06-24 DIAGNOSIS — G459 Transient cerebral ischemic attack, unspecified: Secondary | ICD-10-CM | POA: Diagnosis present

## 2019-06-24 DIAGNOSIS — Z7982 Long term (current) use of aspirin: Secondary | ICD-10-CM | POA: Diagnosis not present

## 2019-06-24 DIAGNOSIS — G9340 Encephalopathy, unspecified: Secondary | ICD-10-CM | POA: Diagnosis present

## 2019-06-24 DIAGNOSIS — I248 Other forms of acute ischemic heart disease: Secondary | ICD-10-CM | POA: Diagnosis present

## 2019-06-24 DIAGNOSIS — I6603 Occlusion and stenosis of bilateral middle cerebral arteries: Secondary | ICD-10-CM | POA: Diagnosis not present

## 2019-06-24 DIAGNOSIS — R55 Syncope and collapse: Secondary | ICD-10-CM

## 2019-06-24 DIAGNOSIS — Z20828 Contact with and (suspected) exposure to other viral communicable diseases: Secondary | ICD-10-CM | POA: Diagnosis present

## 2019-06-24 DIAGNOSIS — R569 Unspecified convulsions: Secondary | ICD-10-CM | POA: Diagnosis not present

## 2019-06-24 DIAGNOSIS — G8929 Other chronic pain: Secondary | ICD-10-CM | POA: Diagnosis present

## 2019-06-24 DIAGNOSIS — R1310 Dysphagia, unspecified: Secondary | ICD-10-CM | POA: Diagnosis present

## 2019-06-24 DIAGNOSIS — I4891 Unspecified atrial fibrillation: Secondary | ICD-10-CM | POA: Diagnosis present

## 2019-06-24 DIAGNOSIS — N183 Chronic kidney disease, stage 3 (moderate): Secondary | ICD-10-CM | POA: Diagnosis present

## 2019-06-24 DIAGNOSIS — Z96652 Presence of left artificial knee joint: Secondary | ICD-10-CM | POA: Diagnosis present

## 2019-06-24 DIAGNOSIS — I480 Paroxysmal atrial fibrillation: Secondary | ICD-10-CM | POA: Diagnosis not present

## 2019-06-24 DIAGNOSIS — N179 Acute kidney failure, unspecified: Secondary | ICD-10-CM | POA: Diagnosis present

## 2019-06-24 DIAGNOSIS — R9431 Abnormal electrocardiogram [ECG] [EKG]: Secondary | ICD-10-CM

## 2019-06-24 DIAGNOSIS — Z79891 Long term (current) use of opiate analgesic: Secondary | ICD-10-CM | POA: Diagnosis not present

## 2019-06-24 DIAGNOSIS — R4189 Other symptoms and signs involving cognitive functions and awareness: Secondary | ICD-10-CM | POA: Diagnosis not present

## 2019-06-24 DIAGNOSIS — Z79899 Other long term (current) drug therapy: Secondary | ICD-10-CM | POA: Diagnosis not present

## 2019-06-24 LAB — BASIC METABOLIC PANEL
Anion gap: 11 (ref 5–15)
BUN: 23 mg/dL (ref 8–23)
CO2: 22 mmol/L (ref 22–32)
Calcium: 8.7 mg/dL — ABNORMAL LOW (ref 8.9–10.3)
Chloride: 108 mmol/L (ref 98–111)
Creatinine, Ser: 1.59 mg/dL — ABNORMAL HIGH (ref 0.61–1.24)
GFR calc Af Amer: 41 mL/min — ABNORMAL LOW (ref >60)
GFR calc non Af Amer: 36 mL/min — ABNORMAL LOW (ref >60)
Glucose, Bld: 125 mg/dL — ABNORMAL HIGH (ref 70–99)
Potassium: 3.7 mmol/L (ref 3.5–5.1)
Sodium: 141 mmol/L (ref 135–145)

## 2019-06-24 LAB — RAPID URINE DRUG SCREEN, HOSP PERFORMED
Amphetamines: NOT DETECTED
Barbiturates: NOT DETECTED
Benzodiazepines: NOT DETECTED
Cocaine: NOT DETECTED
Opiates: NOT DETECTED
Tetrahydrocannabinol: NOT DETECTED

## 2019-06-24 LAB — CBC
HCT: 42.4 % (ref 39.0–52.0)
Hemoglobin: 14.3 g/dL (ref 13.0–17.0)
MCH: 33.3 pg (ref 26.0–34.0)
MCHC: 33.7 g/dL (ref 30.0–36.0)
MCV: 98.8 fL (ref 80.0–100.0)
Platelets: 133 10*3/uL — ABNORMAL LOW (ref 150–400)
RBC: 4.29 MIL/uL (ref 4.22–5.81)
RDW: 13.8 % (ref 11.5–15.5)
WBC: 11.9 10*3/uL — ABNORMAL HIGH (ref 4.0–10.5)
nRBC: 0 % (ref 0.0–0.2)

## 2019-06-24 LAB — LIPID PANEL
Cholesterol: 135 mg/dL (ref 0–200)
HDL: 48 mg/dL (ref >40)
LDL Cholesterol: 80 mg/dL (ref 0–99)
Total CHOL/HDL Ratio: 2.8 RATIO
Triglycerides: 35 mg/dL (ref <150)
VLDL: 7 mg/dL (ref 0–40)

## 2019-06-24 LAB — TROPONIN I (HIGH SENSITIVITY)
Troponin I (High Sensitivity): 79 ng/L — ABNORMAL HIGH (ref <18)
Troponin I (High Sensitivity): 113 ng/L (ref <18)
Troponin I (High Sensitivity): 104 ng/L (ref <18)

## 2019-06-24 LAB — HEMOGLOBIN A1C
Hgb A1c MFr Bld: 5.7 % — ABNORMAL HIGH (ref 4.8–5.6)
Mean Plasma Glucose: 116.89 mg/dL

## 2019-06-24 LAB — SARS CORONAVIRUS 2 (TAT 6-24 HRS): SARS Coronavirus 2: NEGATIVE

## 2019-06-24 LAB — MAGNESIUM: Magnesium: 1.7 mg/dL (ref 1.7–2.4)

## 2019-06-24 LAB — HEPARIN LEVEL (UNFRACTIONATED)
Heparin Unfractionated: 0.75 IU/mL — ABNORMAL HIGH (ref 0.30–0.70)
Heparin Unfractionated: 0.52 IU/mL (ref 0.30–0.70)

## 2019-06-24 LAB — ECHOCARDIOGRAM COMPLETE
Height: 69 in
Weight: 2253.98 oz

## 2019-06-24 LAB — TSH: TSH: 2.98 u[IU]/mL (ref 0.350–4.500)

## 2019-06-24 LAB — T4, FREE: Free T4: 1.19 ng/dL — ABNORMAL HIGH (ref 0.61–1.12)

## 2019-06-24 MED ORDER — POTASSIUM CHLORIDE 10 MEQ/100ML IV SOLN: 10.0000 meq | INTRAVENOUS | Status: DC

## 2019-06-24 MED ORDER — METOPROLOL TARTRATE 12.5 MG HALF TABLET
12.5000 mg | ORAL_TABLET | Freq: Two times a day (BID) | ORAL | Status: DC
  Administered 2019-06-24: 12.5 mg via ORAL
  Filled 2019-06-24 (×2): qty 1

## 2019-06-24 MED ORDER — ATORVASTATIN CALCIUM 40 MG PO TABS
40.0000 mg | ORAL_TABLET | Freq: Every day | ORAL | Status: DC
  Administered 2019-06-24: 40 mg via ORAL
  Filled 2019-06-24: qty 1

## 2019-06-24 MED ORDER — POTASSIUM CHLORIDE CRYS ER 20 MEQ PO TBCR
40.0000 meq | EXTENDED_RELEASE_TABLET | Freq: Once | ORAL | Status: DC
  Administered 2019-06-24: 40 meq via ORAL
  Filled 2019-06-24: qty 2

## 2019-06-24 MED ORDER — SODIUM CHLORIDE 0.9 % IV SOLN
INTRAVENOUS | Status: DC | PRN
  Administered 2019-06-24 – 2019-06-25 (×2): 250 mL via INTRAVENOUS

## 2019-06-24 MED ORDER — MAGNESIUM SULFATE 2 GM/50ML IV SOLN
2.0000 g | Freq: Once | INTRAVENOUS | Status: AC
  Administered 2019-06-24: 03:00:00 2 g via INTRAVENOUS
  Filled 2019-06-24: qty 50

## 2019-06-24 NOTE — Progress Notes (Signed)
EEG complete - results pending 

## 2019-06-24 NOTE — Progress Notes (Signed)
[facility] for heparin Indication: atrial fibrillation  Allergies  Allergen Reactions  . Penicillins Anaphylaxis, Hives and Swelling    Did it involve swelling of the face/tongue/throat, SOB, or low BP? Unk Did it involve sudden or severe rash/hives, skin peeling, or any reaction on the inside of your mouth or nose? Unk Did you need to seek medical attention at a hospital or doctor's office? No When did it last happen? Unk If all above answers are "NO", may proceed with cephalosporin use.     Patient Measurements: Height: 5\' 9"  (175.3 cm)(per pt) Weight: 140 lb 14 oz (63.9 kg) IBW/kg (Calculated) : 70.7 Heparin Dosing Weight: 64.7kg  Vital Signs: Temp: 98.3 F (36.8 C) (09/24 0600) Temp Source: Oral (09/24 0600) BP: 105/48 (09/24 0600) Pulse Rate: 79 (09/24 0331)  Labs: Recent Labs    06/23/19 1752 06/23/19 1810 06/23/19 2055 06/24/19 0220 06/24/19 0522 06/24/19 0638  HGB 15.6 16.3  --   --  14.3  --   HCT 48.4 48.0  --   --  42.4  --   PLT 139*  --   --   --  133*  --   APTT 25  --   --   --   --   --   LABPROT 13.7  --   --   --   --   --   INR 1.1  --   --   --   --   --   HEPARINUNFRC  --   --   --   --   --  0.75*  CREATININE 1.56* 1.40*  --   --  1.59*  --   TROPONINIHS  --   --  62* 79* 113*  --     Estimated Creatinine Clearance: 23.4 mL/min (A) (by C-G formula based on SCr of 1.59 mg/dL (H)).   Medical History: Past Medical History:  Diagnosis Date  . Arthritis   . Cancer of prostate ([place])   . Joint pain   . Joint swelling     Medications:  Infusions:  . sodium chloride 75 mL/hr at 06/23/19 2302  . amiodarone Stopped (06/24/19 [id number])  . heparin 900 Units/hr (06/23/19 2312)    Assessment: 65 yom presented to the ED as a code stroke however, MRI was reported as negative for any acute findings. Found to be in afib and now starting IV heparin. Baseline Hgb is WNL and platelets are slightly low. He is not on  anticoagulation PTA.   Initial heparin level supratherapeutic at 0.75  Goal of Therapy:  Heparin level 0.3-0.7 units/ml Monitor platelets by anticoagulation protocol: Yes   Plan:  Decrease heparin gtt to 800 units/hr F/u 8 hour heparin level  Bertis Ruddy, PharmD Clinical Pharmacist Please check AMION for all [place] numbers 06/24/2019 7:10 AM

## 2019-06-24 NOTE — Progress Notes (Signed)
  Echocardiogram 2D Echocardiogram has been performed.  Alan Rios 06/24/2019, 9:55 AM

## 2019-06-24 NOTE — Progress Notes (Addendum)
PROGRESS NOTE    Alan Rios  [id number] DOB: 01/02/1919 DOA: 06/23/2019 PCP: [facility]   Brief Narrative:   Alan Rios is Alan Rios 85 y.o. male with medical history significant of arthritis, prostate cancer presenting the hospital via EMS for evaluation of unresponsiveness.  Upon EMS arrival, patient had repetitive answers to questions and had difficulty following commands.  Found to be in Alan Rios with RVR with rate ranging from 150-170.  Patient states he was doing yard work and then just sat down because he was not feeling good.  He then remembers waking up.  Denies any chest pain, shortness of breath, heart palpitations, or lightheadedness/dizziness.  He has no other complaints.  Denies any recent illness, fevers, or chills.  Denies abdominal pain or diarrhea.  Son at bedside states he lives very close to the patient and checks on him several times Alan Rios day.  Today patient was doing yard work and his son had gone somewhere.  When the son returned he noticed that the patient was sitting on Alan Rios chair with his eyes closed.  Patient opened his eyes after his son shook him and started vomiting.  He was then just staring and not responding.  Son confirms that patient has not had any recent illness.  He has not had any fevers.  He has not complained of any chest pain or shortness of breath.  He does not have Alan Rios history of seizures.  ED Course: Found to be in Alan Rios. Rios with RVR with rate in the 150s.  Blood pressure stable.  Patient received IV Cardizem 15 mg total in the ED.  Afebrile and no leukocytosis.  Not tachypneic or hypoxic.  Platelet count 139,000, chronically low.  Blood ethanol level negative.  Bicarb 18, anion gap 14.  Blood glucose 129.  BUN 24, creatinine 1.5.  Baseline creatinine 1.0-1.1.  UDS pending.  UA pending.  High-sensitivity troponin pending.  Chest x-ray showing no active disease.  Head CT negative for acute finding.  Brain MRI negative for acute  finding.  Assessment & Plan:   Principal Problem:   Atrial fibrillation with rapid ventricular response (HCC) Active Problems:   Syncope   QT prolongation   AKI (acute kidney injury) (Alan Rios)   Thrombocytopenia (HCC)  New onset Alan Rios with RVR/ arrhythmia  Patient initially found to be Alan Rios with RVR with rate ranging from 150-170.   Initially received dilt, but transitioned to amiodarone based on cardiology recommendations (curbside by admitting provider) EKG this AM, pt has converted to sinus Will plan to discontinue amiodarone and transition to metoprolol and follow rate TSH wnl (free T4 slightly elevated) Echo pending A1c, lipid panel He's on heparin gtt for new afib Chadsvasc is 2 for age Plan for doac at discharge, will discuss risks/benefits with family  Syncope   Acute Encephalopathy   Left Sided Weakness Seen by neurology recommending TIA workup and EEG for r/o seizures Follow MRA (pending) MRI without acute stroke EEG pending Continue telemetry PT/OT/SLP Troponin elevated, see below Appreciate neurology recs  Troponin Elevation Suspect this may be demand ischemia related to his afib with RVR EKG at presentation with afib with RVR.  This morning EKG with T wave inversions from I, aVL,and V2-V6.  Isolated in II. High sensitivity troponin was elevated, but he denies CP and suspect was 2/2 his RVR Troponin 62 -> 79 -> 113.  Will repeat once more this afternoon to see if downtrends or stabilizes. Echo without wall motion  abnormality and with normal EF. As above, suspect this is demand.  Will follow repeat trop and EKG.    QTC prolongation on EKG Improved at 435 this AM  AKI? BUN 24, creatinine 1.5.   Baseline creatinine 1.0-1.1. in 2016 Continue IVF hydration This maybe his recent baseline  Chronic thrombocytopenia Platelet count 139,000, chronically low.  No signs of active bleeding. -Continue to monitor, relatively stable today  Chronic pain -Continue  home Norco PRN  Abnormal TFT: follow outpatient  DVT prophylaxis: heparin Code Status: full Family Communication: son over phone Disposition Plan: Pt requires inpatient management to complete workup.  Pt admitted for AMS and L sided weakness and has pending MRA, EEG results, and neurology s/o.   Consultants:   Neurology  Cards over phone  Procedures:   none  Antimicrobials: Anti-infectives (From admission, onward)   None     Subjective: Denies CP or SOB  Objective: Vitals:   06/24/19 0412 06/24/19 0600 06/24/19 0922 06/24/19 1350  BP: (!) 98/48 (!) 105/48 (!) 108/50 (!) 118/49  Pulse:    81  Resp: 20 19  16   Temp: 98.1 F (36.7 C) 98.3 F (36.8 C)  98.2 F (36.8 C)  TempSrc: Oral Oral  Oral  SpO2: 95% 94%  94%  Weight:      Height:       No intake or output data in the 24 hours ending 06/24/19 1559 Filed Weights   06/23/19 1828 06/23/19 2321 06/24/19 0331  Weight: 64.7 kg 63.9 kg 63.9 kg    Examination:  General exam: Appears calm and comfortable  Respiratory system: Clear to auscultation. Respiratory effort normal. Cardiovascular system: S1 & S2 heard, RRR.  Gastrointestinal system: Abdomen is nondistended, soft and nontender. Central nervous system: Alert and oriented. No focal neurological deficits. Extremities: no LEE. Skin: No rashes, lesions or ulcers Psychiatry: Judgement and insight appear normal. Mood & affect appropriate.     Data Reviewed: I have personally reviewed following labs and imaging studies  CBC: Recent Labs  Lab 06/23/19 1752 06/23/19 1810 06/24/19 0522  WBC 6.3  --  11.9*  NEUTROABS 2.2  --   --   HGB 15.6 16.3 14.3  HCT 48.4 48.0 42.4  MCV 105.0*  --  98.8  PLT 139*  --  [id number]*   Basic Metabolic Panel: Recent Labs  Lab 06/23/19 1752 06/23/19 1810 06/24/19 0220 06/24/19 0522  NA 141 140  --  141  K 3.6 3.6  --  3.7  CL 109 112*  --  108  CO2 18*  --   --  22  GLUCOSE 129* 127*  --  125*  BUN 24* 30*  --  23   CREATININE 1.56* 1.40*  --  1.59*  CALCIUM 9.1  --   --  8.7*  MG  --   --  1.7  --    GFR: Estimated Creatinine Clearance: 23.4 mL/min (Alan Rios) (by C-G formula based on SCr of 1.59 mg/dL (H)). Liver Function Tests: Recent Labs  Lab 06/23/19 1752  AST 18  ALT 11  ALKPHOS 70  BILITOT 0.4  PROT 6.6  ALBUMIN 3.2*   No results for input(s): LIPASE, AMYLASE in the last 168 hours. No results for input(s): AMMONIA in the last 168 hours. Coagulation Profile: Recent Labs  Lab 06/23/19 1752  INR 1.1   Cardiac Enzymes: No results for input(s): CKTOTAL, CKMB, CKMBINDEX, TROPONINI in the last 168 hours. BNP (last 3 results) No results for input(s): PROBNP in the  last 8760 hours. HbA1C: No results for input(s): HGBA1C in the last 72 hours. CBG: No results for input(s): GLUCAP in the last 168 hours. Lipid Profile: No results for input(s): CHOL, HDL, LDLCALC, TRIG, CHOLHDL, LDLDIRECT in the last 72 hours. Thyroid Function Tests: Recent Labs    06/23/19 2326  TSH 2.980  FREET4 1.19*   Anemia Panel: No results for input(s): VITAMINB12, FOLATE, FERRITIN, TIBC, IRON, RETICCTPCT in the last 72 hours. Sepsis Labs: No results for input(s): PROCALCITON, LATICACIDVEN in the last 168 hours.  Recent Results (from the past 240 hour(s))  SARS CORONAVIRUS 2 (TAT 6-24 HRS) Nasopharyngeal Nasopharyngeal Swab     Status: None   Collection Time: 06/23/19 10:31 PM   Specimen: Nasopharyngeal Swab  Result Value Ref Range Status   SARS Coronavirus 2 NEGATIVE NEGATIVE Final    Comment: (NOTE) SARS-CoV-2 target nucleic acids are NOT DETECTED. The SARS-CoV-2 RNA is generally detectable in upper and lower respiratory specimens during the acute phase of infection. Negative results do not preclude SARS-CoV-2 infection, do not rule out co-infections with other pathogens, and should not be used as the sole basis for treatment or other patient management decisions. Negative results must be combined with  clinical observations, patient history, and epidemiological information. The expected result is Negative. Fact Sheet for Patients: SugarRoll.be Fact Sheet for Healthcare Providers: https://www.woods-mathews.com/ This test is not yet approved or cleared by the [place] FDA and  has been authorized for detection and/or diagnosis of SARS-CoV-2 by FDA under an Emergency Use Authorization (EUA). This EUA will remain  in effect (meaning this test can be used) for the duration of the COVID-19 declaration under Section 56 4(b)(1) of the Act, 21 U.S.C. section 360bbb-3(b)(1), unless the authorization is terminated or revoked sooner. Performed at [facility], [place] [address] [place], [place] [address]          Radiology Studies: Mr Brain Wo Contrast (neuro Protocol)  Result Date: 06/23/2019 CLINICAL DATA:  Altered mental status. Weakness. Vomiting. Last seen normal 1500 hours. EXAM: MRI HEAD WITHOUT CONTRAST TECHNIQUE: Multiplanar, multiecho pulse sequences of the brain and surrounding structures were obtained without intravenous contrast. COMPARISON:  Head CT same day.  MRI 11/12/2013 FINDINGS: Brain: Diffusion imaging does not show any acute or subacute infarction. The brainstem and cerebellum are normal. Cerebral hemispheres show confluent chronic small vessel ischemic changes of the deep and subcortical white matter. No cortical or large vessel territory infarction. No mass lesion, hemorrhage, hydrocephalus or extra-axial collection. Vascular: Major vessels at the base of the brain show flow. Skull and upper cervical spine: Negative Sinuses/Orbits: Clear/normal Other: None IMPRESSION: No acute finding. Age related atrophy. Chronic small-vessel ischemic changes throughout the cerebral hemispheric white matter. White matter changes are slightly progressive since 2017. Electronically Signed   By: Nelson Chimes M.D.   On: 06/23/2019 20:22   Dg  Chest Port 1 View  Result Date: 06/23/2019 CLINICAL DATA:  Weakness EXAM: PORTABLE CHEST 1 VIEW COMPARISON:  11/12/2013 FINDINGS: Heart and mediastinal contours are within normal limits. No focal opacities or effusions. No acute bony abnormality. IMPRESSION: No active disease. Electronically Signed   By: Rolm Baptise M.D.   On: 06/23/2019 18:59   Ct Head Code Stroke Wo Contrast  Result Date: 06/23/2019 CLINICAL DATA:  Code stroke.  Unresponsive. EXAM: CT HEAD WITHOUT CONTRAST TECHNIQUE: Contiguous axial images were obtained from the base of the skull through the vertex without intravenous contrast. COMPARISON:  11/12/2018 FINDINGS: Brain: Extensive chronic small-vessel ischemic changes  of the cerebral hemispheric white matter. No CT evidence of acute infarction, mass lesion, hemorrhage, hydrocephalus or extra-axial collection. Vascular: There is atherosclerotic calcification of the major vessels at the base of the brain. Skull: Negative Sinuses/Orbits: Clear/normal Other: None ASPECTS ([place] Stroke Program Early CT Score) - Ganglionic level infarction (caudate, lentiform nuclei, internal capsule, insula, M1-M3 cortex): 7 - Supraganglionic infarction (M4-M6 cortex): 3 Total score (0-10 with 10 being normal): 10 IMPRESSION: 1. No acute finding by CT. Chronic small-vessel ischemic changes of the cerebral hemispheric white matter. 2. ASPECTS is 10 3. These results were communicated to Dr. Cheral Marker at [facility] 9/23/2020by text page via the [facility] messaging system. Electronically Signed   By: Nelson Chimes M.D.   On: 06/23/2019 18:14   Vas US Carotid  Result Date: 06/24/2019 Carotid Arterial Duplex Study Indications:       Syncope. Other Factors:     Atrial fibrillation with RVR, prostate cancer. Comparison Study:  No prior study Performing Technologist: Sharion Dove RVS  Examination Guidelines: Keeton Kassebaum complete evaluation includes B-mode imaging, spectral Doppler, color Doppler, and power Doppler as needed of all  accessible portions of each vessel. Bilateral testing is considered an integral part of Zoee Heeney complete examination. Limited examinations for reoccurring indications may be performed as noted.  Right Carotid Findings: +----------+--------+--------+--------+------------------+--------+             PSV cm/s EDV cm/s Stenosis Plaque Description Comments  +----------+--------+--------+--------+------------------+--------+  CCA Prox   107      12                heterogenous                 +----------+--------+--------+--------+------------------+--------+  CCA Distal 73       9                 homogeneous                  +----------+--------+--------+--------+------------------+--------+  ICA Prox   77       17                homogeneous                  +----------+--------+--------+--------+------------------+--------+  ICA Distal 75       12                                             +----------+--------+--------+--------+------------------+--------+  ECA        146      3                                              +----------+--------+--------+--------+------------------+--------+ +----------+--------+-------+--------+-------------------+             PSV cm/s EDV cms Describe Arm Pressure (mmHG)  +----------+--------+-------+--------+-------------------+  Subclavian 104                                            +----------+--------+-------+--------+-------------------+ +---------+--------+---+--------+--+  Vertebral PSV cm/s 101 EDV cm/s 10  +---------+--------+---+--------+--+  Left Carotid Findings: +----------+--------+--------+--------+------------------+------------------+  PSV cm/s EDV cm/s Stenosis Plaque Description Comments            +----------+--------+--------+--------+------------------+------------------+  CCA Prox   118      10                                   intimal thickening  +----------+--------+--------+--------+------------------+------------------+  CCA Distal 90       10                                    intimal thickening  +----------+--------+--------+--------+------------------+------------------+  ICA Prox   101      15                homogeneous        Shadowing           +----------+--------+--------+--------+------------------+------------------+  ICA Distal 101      16                                                       +----------+--------+--------+--------+------------------+------------------+  ECA        112      2                                                        +----------+--------+--------+--------+------------------+------------------+ +----------+--------+--------+--------+-------------------+             PSV cm/s EDV cm/s Describe Arm Pressure (mmHG)  +----------+--------+--------+--------+-------------------+  Subclavian 213                                             +----------+--------+--------+--------+-------------------+ +---------+--------+--+--------+-+  Vertebral PSV cm/s 69 EDV cm/s 6  +---------+--------+--+--------+-+  Summary: Right Carotid: Velocities in the right ICA are consistent with Inda Mcglothen 1-39% stenosis. Left Carotid: Velocities in the left ICA are consistent with Deniz Hannan 1-39% stenosis. Vertebrals:  Bilateral vertebral arteries demonstrate antegrade flow. Subclavians: Normal flow hemodynamics were seen in bilateral subclavian              arteries. *See table(s) above for measurements and observations.     Preliminary         Scheduled Meds:  aspirin EC  81 mg Oral Daily   bethanechol  25 mg Oral BID   tamsulosin  0.4 mg Oral BID WC   Continuous Infusions:  amiodarone Stopped (06/24/19 0338)   heparin 800 Units/hr (06/24/19 0729)     LOS: 0 days    Time spent: over 30 min    Fayrene Helper, MD [facility] Pager AMION  If 7PM-7AM, please contact night-coverage [email or web address] Password [facility] 06/24/2019, 3:59 PM

## 2019-06-24 NOTE — Progress Notes (Signed)
VASCULAR LAB PRELIMINARY  PRELIMINARY  PRELIMINARY  PRELIMINARY  Carotid duplex completed.    Preliminary report:  See CV proc for preliminary results.   Dameion Briles, RVT 06/24/2019, 1:14 PM

## 2019-06-24 NOTE — Care Management (Signed)
1616 06-24-19 Benefits Check submitted for Eliquis. CM will make pt aware of cost once completed. Bethena Roys, RN, BSN Case Manager [phone]

## 2019-06-24 NOTE — Progress Notes (Signed)
CRITICAL VALUE ALERT  Critical Value:  Trop 113  Date & Time Notied:  06/24/2019 at 0635  Provider Notified: Wandra Feinstein Rathore,MD  Orders Received/Actions taken: will continue to await orders from MD

## 2019-06-24 NOTE — Progress Notes (Addendum)
[facility] for heparin Indication: atrial fibrillation  Allergies  Allergen Reactions  . Penicillins Anaphylaxis, Hives and Swelling    Did it involve swelling of the face/tongue/throat, SOB, or low BP? Unk Did it involve sudden or severe rash/hives, skin peeling, or any reaction on the inside of your mouth or nose? Unk Did you need to seek medical attention at a hospital or doctor's office? No When did it last happen? Unk If all above answers are "NO", may proceed with cephalosporin use.     Patient Measurements: Height: 5\' 9"  (175.3 cm)(per pt) Weight: 140 lb 14 oz (63.9 kg) IBW/kg (Calculated) : 70.7 Heparin Dosing Weight: 64.7kg  Vital Signs: Temp: 98.2 F (36.8 C) (09/24 1350) Temp Source: Oral (09/24 1350) BP: 118/49 (09/24 1350) Pulse Rate: 81 (09/24 1350)  Labs: Recent Labs    06/23/19 1752 06/23/19 1810 06/23/19 2055 06/24/19 0220 06/24/19 0522 06/24/19 0638 06/24/19 1448  HGB 15.6 16.3  --   --  14.3  --   --   HCT 48.4 48.0  --   --  42.4  --   --   PLT 139*  --   --   --  133*  --   --   APTT 25  --   --   --   --   --   --   LABPROT 13.7  --   --   --   --   --   --   INR 1.1  --   --   --   --   --   --   HEPARINUNFRC  --   --   --   --   --  0.75* 0.52  CREATININE 1.56* 1.40*  --   --  1.59*  --   --   TROPONINIHS  --   --  62* 79* 113*  --   --     Estimated Creatinine Clearance: 23.4 mL/min (A) (by C-G formula based on SCr of 1.59 mg/dL (H)).   Medical History: Past Medical History:  Diagnosis Date  . Arthritis   . Cancer of prostate ([place])   . Joint pain   . Joint swelling     Medications:  Infusions:  . sodium chloride 250 mL (06/24/19 1603)  . heparin 800 Units/hr (06/24/19 0729)    Assessment: 62 yom presented to the ED as a code stroke however, MRI was reported as negative for any acute findings. Found to be in afib and now starting IV heparin. Baseline Hgb is WNL and platelets are slightly  low. He is not on anticoagulation PTA.   Initial heparin level (drawn at 06:38 AM today) was supratherapeutic at 0.75 units/ml; heparin infusion rate was decreased to 800 units/hr. Heparin level drawn this afternoon ~7.5 hrs after infusion rate was decreased to 800 units/hr was 0.52 units/hr, which is within the goal range for this patient. Per RN, pt had small amount of bleeding around IV site, but no issues since she redressed site.  H/H WNL; platelets 133 (stable)  Goal of Therapy:  Heparin level 0.3-0.7 units/ml Monitor platelets by anticoagulation protocol: Yes   Plan:  Contin heparin infusion at 800 units/hr Check heparin level in 8 hrs and daily Monitor CBC daily Monitor for signs/symptoms of bleeding  Gillermina Hu, PharmD, BCPS, [facility] Clinical Pharmacist 06/24/2019 4:09 PM

## 2019-06-24 NOTE — Evaluation (Signed)
Physical Therapy Evaluation Patient Details Name: Alan Rios MRN: [id number] DOB: 05/04/1919 Today's Date: 06/24/2019   History of Present Illness  Pt is a 85 y/o male admitted after episode of unresponsiveness. MRI negative for acute abnormality. Found to have a fib with RVR. PMH includes prostate cancer and L TKA.   Clinical Impression  Pt admitted secondary to problem above with deficits below. Pt with slowed processing and requiring multimodal cues for mobility tasks. Pt requiring mod A to stand and take steps at EOB using RW. Noted posterior lean and unable to achieve full upright. HR up to low 90s during mobility. Pt currently lives alone and feel he is a high fall risk. Will continue to follow acutely to maximize functional mobility independence and safety.     Follow Up Recommendations SNF;Supervision/Assistance - 24 hour    Equipment Recommendations  None recommended by PT    Recommendations for Other Services       Precautions / Restrictions Precautions Precautions: Fall Restrictions Weight Bearing Restrictions: No      Mobility  Bed Mobility Overal bed mobility: Needs Assistance Bed Mobility: Supine to Sit;Sit to Supine     Supine to sit: Mod assist Sit to supine: Min assist   General bed mobility comments: Mod A for LE assist and trunk elevation to come to sitting. Increased time required and required multimodal cues for sequencing. Min A for LE assist for return to supine.   Transfers Overall transfer level: Needs assistance Equipment used: Rolling walker (2 wheeled) Transfers: Sit to/from Stand Sit to Stand: Mod assist;From elevated surface         General transfer comment: Mod A for lift assist and steadying to stand. Pt had BM upon standing. Only able to tolerate short periods in standing, so had to stand X2. Very flexed posture and posterior lean in standing.   Ambulation/Gait Ambulation/Gait assistance: Mod assist   Assistive device: Rolling  walker (2 wheeled)       General Gait Details: Pt taking side steps at EOB for repositioning after clean up following BM. Pt requiring mod A for steadying assist. Posterior lean noted.   Stairs            Wheelchair Mobility    Modified Rankin (Stroke Patients Only)       Balance Overall balance assessment: Needs assistance Sitting-balance support: No upper extremity supported;Feet supported Sitting balance-Leahy Scale: Fair   Postural control: Posterior lean Standing balance support: Bilateral upper extremity supported;During functional activity Standing balance-Leahy Scale: Poor Standing balance comment: Reliant on UE and external support                              Pertinent Vitals/Pain Pain Assessment: No/denies pain    Home Living Family/patient expects to be discharged to:: Private residence Living Arrangements: Alone Available Help at Discharge: Family;Available PRN/intermittently Type of Home: House Home Access: Level entry     Home Layout: One level Home Equipment: Walker - 2 wheels;Cane - single point      Prior Function Level of Independence: Independent with assistive device(s)         Comments: Reports using cane and RW     Hand Dominance        Extremity/Trunk Assessment   Upper Extremity Assessment Upper Extremity Assessment: Defer to OT evaluation    Lower Extremity Assessment Lower Extremity Assessment: Generalized weakness    Cervical / Trunk Assessment Cervical / Trunk Assessment:  Kyphotic  Communication   Communication: HOH  Cognition Arousal/Alertness: Awake/alert Behavior During Therapy: Flat affect Overall Cognitive Status: No family/caregiver present to determine baseline cognitive functioning                                 General Comments: Pt alert and oriented, however, slow to respond to questions. Pt also requiring multimodal cues for sequencing when performing mobility tasks.        General Comments      Exercises     Assessment/Plan    PT Assessment Patient needs continued PT services  PT Problem List Decreased strength;Decreased balance;Decreased mobility;Decreased cognition;Decreased knowledge of use of DME;Decreased knowledge of precautions       PT Treatment Interventions DME instruction;Gait training;Therapeutic activities;Functional mobility training;Balance training;Therapeutic exercise;Patient/family education    PT Goals (Current goals can be found in the Care Plan section)  Acute Rehab PT Goals Patient Stated Goal: none stated PT Goal Formulation: With patient Time For Goal Achievement: 07/08/19 Potential to Achieve Goals: Good    Frequency Min 2X/week   Barriers to discharge Decreased caregiver support      Co-evaluation               AM-PAC PT "6 Clicks" Mobility  Outcome Measure Help needed turning from your back to your side while in a flat bed without using bedrails?: A Lot Help needed moving from lying on your back to sitting on the side of a flat bed without using bedrails?: A Lot Help needed moving to and from a bed to a chair (including a wheelchair)?: A Lot Help needed standing up from a chair using your arms (e.g., wheelchair or bedside chair)?: A Lot Help needed to walk in hospital room?: A Lot Help needed climbing 3-5 steps with a railing? : Total 6 Click Score: 11    End of Session Equipment Utilized During Treatment: Gait belt Activity Tolerance: Patient tolerated treatment well Patient left: in bed;with call bell/phone within reach;with bed alarm set Nurse Communication: Mobility status;Other (comment)(Pt had BM ) PT Visit Diagnosis: Unsteadiness on feet (R26.81);Muscle weakness (generalized) (M62.81)    Time: [id number] PT Time Calculation (min) (ACUTE ONLY): 34 min   Charges:   PT Evaluation $PT Eval Moderate Complexity: 1 Mod PT Treatments $Therapeutic Activity: 8-22 mins        Leighton Ruff, PT, DPT  Acute Rehabilitation Services  Pager: [phone] Office: [phone]   Rudean Hitt 06/24/2019, 4:48 PM

## 2019-06-24 NOTE — TOC Benefit Eligibility Note (Signed)
Transition of Care [facility]) Benefit Eligibility Note    Patient Details  Name: Alan Rios MRN: [id number] Date of Birth: 1919/07/29   Medication/Dose: Arne [place]  2.5 MG BID  AND ELIQUIS  5 MG BID  Covered?: Yes  Tier: 3 Drug  Prescription Coverage [facility] [place] with Person/Company/Phone Number:: [facility] [place] [id number] # [phone]  Co-Pay: $47.00  Prior Approval: No  Deductible: Unmet       Memory Argue Phone Number: 06/24/2019, 4:58 PM

## 2019-06-25 DIAGNOSIS — R4189 Other symptoms and signs involving cognitive functions and awareness: Secondary | ICD-10-CM

## 2019-06-25 DIAGNOSIS — R55 Syncope and collapse: Secondary | ICD-10-CM

## 2019-06-25 LAB — CBC
HCT: 38.8 % — ABNORMAL LOW (ref 39.0–52.0)
Hemoglobin: 12.7 g/dL — ABNORMAL LOW (ref 13.0–17.0)
MCH: 33.1 pg (ref 26.0–34.0)
MCHC: 32.7 g/dL (ref 30.0–36.0)
MCV: 101 fL — ABNORMAL HIGH (ref 80.0–100.0)
Platelets: 122 10*3/uL — ABNORMAL LOW (ref 150–400)
RBC: 3.84 MIL/uL — ABNORMAL LOW (ref 4.22–5.81)
RDW: 14.5 % (ref 11.5–15.5)
WBC: 10.9 10*3/uL — ABNORMAL HIGH (ref 4.0–10.5)
nRBC: 0 % (ref 0.0–0.2)

## 2019-06-25 LAB — HEPARIN LEVEL (UNFRACTIONATED): Heparin Unfractionated: 0.3 IU/mL (ref 0.30–0.70)

## 2019-06-25 LAB — COMPREHENSIVE METABOLIC PANEL
ALT: 11 U/L (ref 0–44)
AST: 23 U/L (ref 15–41)
Albumin: 2.5 g/dL — ABNORMAL LOW (ref 3.5–5.0)
Alkaline Phosphatase: 52 U/L (ref 38–126)
Anion gap: 7 (ref 5–15)
BUN: 23 mg/dL (ref 8–23)
CO2: 24 mmol/L (ref 22–32)
Calcium: 8.4 mg/dL — ABNORMAL LOW (ref 8.9–10.3)
Chloride: 108 mmol/L (ref 98–111)
Creatinine, Ser: 1.47 mg/dL — ABNORMAL HIGH (ref 0.61–1.24)
GFR calc Af Amer: 45 mL/min — ABNORMAL LOW (ref >60)
GFR calc non Af Amer: 39 mL/min — ABNORMAL LOW (ref >60)
Glucose, Bld: 123 mg/dL — ABNORMAL HIGH (ref 70–99)
Potassium: 4.1 mmol/L (ref 3.5–5.1)
Sodium: 139 mmol/L (ref 135–145)
Total Bilirubin: 1.1 mg/dL (ref 0.3–1.2)
Total Protein: 5.8 g/dL — ABNORMAL LOW (ref 6.5–8.1)

## 2019-06-25 LAB — LIPID PANEL
Cholesterol: 115 mg/dL (ref 0–200)
HDL: 43 mg/dL (ref >40)
LDL Cholesterol: 64 mg/dL (ref 0–99)
Total CHOL/HDL Ratio: 2.7 RATIO
Triglycerides: 40 mg/dL (ref <150)
VLDL: 8 mg/dL (ref 0–40)

## 2019-06-25 LAB — MAGNESIUM: Magnesium: 2.3 mg/dL (ref 1.7–2.4)

## 2019-06-25 MED ORDER — APIXABAN 2.5 MG PO TABS: 2.5000 mg | ORAL_TABLET | Freq: Two times a day (BID) | ORAL | 0 refills | Status: DC

## 2019-06-25 MED ORDER — METOPROLOL TARTRATE 25 MG PO TABS: 12.5000 mg | ORAL_TABLET | Freq: Two times a day (BID) | ORAL | 0 refills | Status: AC

## 2019-06-25 MED ORDER — APIXABAN 2.5 MG PO TABS
2.5000 mg | ORAL_TABLET | Freq: Two times a day (BID) | ORAL | Status: DC
  Administered 2019-06-25: 2.5 mg via ORAL
  Filled 2019-06-25: qty 1

## 2019-06-25 MED ORDER — PRAVASTATIN SODIUM 20 MG PO TABS: 20.0000 mg | ORAL_TABLET | Freq: Every evening | ORAL | 0 refills | Status: DC

## 2019-06-25 MED FILL — PRAVASTATIN NA 20 MG TAB: 20 | 30 days supply | Qty: 30 | Fill #0

## 2019-06-25 MED FILL — ELIQUIS 2.5 MG TABLET: 2.5 | 30 days supply | Qty: 60 | Fill #0

## 2019-06-25 MED FILL — METOPROLOL TARTRATE 25 MG T: 25 | 30 days supply | Qty: 30 | Fill #0

## 2019-06-25 NOTE — Plan of Care (Signed)
  Problem: Clinical Measurements: Goal: Will remain free from infection Outcome: Progressing Note: No s/s of infection noted. Goal: Respiratory complications will improve Outcome: Progressing Note: Stable on room air.  No s/s of respiratory complications noted.   

## 2019-06-25 NOTE — Discharge Instructions (Signed)

## 2019-06-25 NOTE — TOC Initial Note (Signed)
Transition of Care [facility]) - Initial/Assessment Note    Patient Details  Name: Alan Rios MRN: [id number] Date of Birth: 08/14/1919  Transition of [facility]) CM/SW Contact:    Bethena Roys, RN Phone Number: 06/25/2019, 4:31 PM  Clinical Narrative: Pt presented for Atriab Fib- PTA from home alone. Patient has support of son that lives behind him. PT recommendations initially for SNF- agreeable to home as he declines SNF. Patient is agreeable to [place] and he chose [facility]. Services to begin within 24-48 hours post transition home. DME 3n1 to be delivered to room. Medications to be filled via the [place]. No further needs from CM at this time.                   Expected Discharge Plan: [place] Barriers to Discharge: No Barriers Identified   Patient Goals and CMS Choice Patient states their goals for this hospitalization and ongoing recovery are:: "to return home" CMS Medicare.gov Compare Post Acute Care list provided to:: Patient Choice offered to / list presented to : Patient  Expected Discharge Plan and Services Expected Discharge Plan: [place] In-house Referral: NA Discharge Planning Services: CM Consult Post Acute Care Choice: [place] arrangements for the past 2 months: Single Family Home Expected Discharge Date: 06/25/19               DME Arranged: 3-N-1 DME Agency: [facility] Date DME Agency Contacted: 06/25/19 Time DME Agency Contacted: [phone] Representative spoke with at DME Agency: [place]: RN, Disease Management, PT, OT, Nurse's Aide, Social Work [employer] Agency: [facility] Date [place]: 06/25/19 Time [place]: 60 Representative spoke with at [place]: [place]  Prior Living Arrangements/Services Living arrangements for the past 2 months: [place] with:: Self(son lives behind the patient.) Patient  language and need for interpreter reviewed:: Yes Do you feel safe going back to the place where you live?: Yes      Need for Family Participation in Patient Care: Yes (Comment) Care giver support system in place?: Yes (comment)   Criminal Activity/Legal Involvement Pertinent to Current Situation/Hospitalization: No - Comment as needed  Activities of Daily Living Home Assistive Devices/Equipment: None ADL Screening (condition at time of admission) Patient's cognitive ability adequate to safely complete daily activities?: Yes Is the patient deaf or have difficulty hearing?: Yes Does the patient have difficulty seeing, even when wearing glasses/contacts?: No Does the patient have difficulty concentrating, remembering, or making decisions?: No Patient able to express need for assistance with ADLs?: Yes Does the patient have difficulty dressing or bathing?: No Independently performs ADLs?: Yes (appropriate for developmental age) Does the patient have difficulty walking or climbing stairs?: Yes Weakness of Legs: Both Weakness of Arms/Hands: None  Permission Sought/Granted Permission sought to share information with : Family Supports, Chartered certified accountant granted to share information with : Yes, Verbal Permission Granted     Permission granted to share info w AGENCY: [facility]        Emotional Assessment Appearance:: Appears stated age Attitude/Demeanor/Rapport: Engaged   Orientation: : Oriented to Place, Oriented to Self, Oriented to  Time, Oriented to Situation Alcohol / Substance Use: Not Applicable Psych Involvement: No (comment)  Admission diagnosis:  Syncope, unspecified syncope type [R55] Atrial fibrillation, unspecified type [facility]) [I48.91] Patient Active Problem List   Diagnosis Date Noted  .  Syncope 06/24/2019  . QT prolongation 06/24/2019  . AKI (acute kidney injury) ([place]) 06/24/2019  . Thrombocytopenia ([place]) 06/24/2019  .  Atrial fibrillation with rapid ventricular response ([place]) 06/23/2019  . Arthritis of left knee 11/14/2014   PCP:  Practice, [place]:   [facility] [address] [place] [address] Phone: [phone] Fax: [phone]  [id number] [place], [place] [address] Phone: [phone] Fax: [place], [place] - [address] [place] [address] Phone: [phone] Fax: [phone]     Social Determinants of Health (SDOH) Interventions    Readmission Risk Interventions No flowsheet data found.

## 2019-06-25 NOTE — Evaluation (Signed)
Clinical/Bedside Swallow Evaluation Patient Details  Name: Alan Rios MRN: [id number] Date of Birth: 11-May-1919  Today's Date: 06/25/2019 Time: SLP Start Time (ACUTE ONLY): 75 SLP Stop Time (ACUTE ONLY): 1323 SLP Time Calculation (min) (ACUTE ONLY): 11 min  Past Medical History:  Past Medical History:  Diagnosis Date  . Arthritis   . Cancer of prostate ([place])   . Joint pain   . Joint swelling    Past Surgical History:  Past Surgical History:  Procedure Laterality Date  . cataract surgery Bilateral   . TOTAL KNEE ARTHROPLASTY Left 11/14/2014   Procedure: TOTAL KNEE ARTHROPLASTY;  Surgeon: Kerin Salen, MD;  Location: [place];  Service: Orthopedics;  Laterality: Left;   HPI:  Pt is a 85 y/o male admitted after episode of unresponsiveness. MRI negative for acute abnormality. Found to have a fib with RVR. PMH includes prostate cancer and L TKA.  Son at bedside, reports no prior swallowing difficulties. Pt passed [facility] swallow screen, then noted to have some coughing with breakfast.   Assessment / Plan / Recommendation Clinical Impression  Pt presents with functional swallowing - oral mechanism exam is unremarkable.  Pt demonstrates active mastication of regular solids.  Initial swallow of thin liquids was accompanied by a cough; this was not repeated during subsequent swallows.  Purees swallowed without difficulty. No oral residue present post-swallow.  Recommend continuing a regular diet; thin liquids.  Pt should sit upright for all PO intake; if he coughs when taking pills in water, offer them whole in applesauce or pudding.  No further SLP f/u is warranted.  Our service will sign off.  SLP Visit Diagnosis: Dysphagia, unspecified (R13.10)           Diet Recommendation   regular solids, thin liquids  Medication Administration: Whole meds with liquid    Other  Recommendations Oral Care Recommendations: Oral care BID   Follow up Recommendations None      Frequency and Duration    n/a                 Swallow Study   General Date of Onset: 06/24/19 HPI: Pt is a 85 y/o male admitted after episode of unresponsiveness. MRI negative for acute abnormality. Found to have a fib with RVR. PMH includes prostate cancer and L TKA.  Son at bedside, reports no prior swallowing difficulties. Type of Study: Bedside Swallow Evaluation Previous Swallow Assessment: none Diet Prior to this Study: Regular;Thin liquids Temperature Spikes Noted: No Respiratory Status: Room air History of Recent Intubation: No Behavior/Cognition: Alert;Cooperative Oral Cavity Assessment: Within Functional Limits Oral Care Completed by SLP: No Oral Cavity - Dentition: Missing dentition Vision: Functional for self-feeding Self-Feeding Abilities: Able to feed self Patient Positioning: Upright in bed Baseline Vocal Quality: Normal Volitional Cough: Strong Volitional Swallow: Able to elicit    Oral/Motor/Sensory Function Overall Oral Motor/Sensory Function: Within functional limits   Ice Chips Ice chips: Within functional limits   Thin Liquid Thin Liquid: Within functional limits    Nectar Thick Nectar Thick Liquid: Not tested   Honey Thick Honey Thick Liquid: Not tested   Puree Puree: Within functional limits   Solid     Solid: Within functional limits     Esdras Delair L. Tivis Ringer, [place] Office number [phone] Pager [phone]  Juan Quam Laurice 06/25/2019,1:25 PM

## 2019-06-25 NOTE — Progress Notes (Signed)
[place] for heparin Indication: atrial fibrillation  Allergies  Allergen Reactions  . Penicillins Anaphylaxis, Hives and Swelling    Did it involve swelling of the face/tongue/throat, SOB, or low BP? Unk Did it involve sudden or severe rash/hives, skin peeling, or any reaction on the inside of your mouth or nose? Unk Did you need to seek medical attention at a hospital or doctor's office? No When did it last happen? Unk If all above answers are "NO", may proceed with cephalosporin use.     Patient Measurements: Height: 5\' 9"  (175.3 cm)(per pt) Weight: 140 lb 14 oz (63.9 kg) IBW/kg (Calculated) : 70.7 Heparin Dosing Weight: 64.7kg  Vital Signs: Temp: 99.3 F (37.4 C) (09/25 0014) Temp Source: Oral (09/25 0014) BP: 117/64 (09/25 0014) Pulse Rate: 79 (09/25 0014)  Labs: Recent Labs    06/23/19 1752 06/23/19 1810  06/24/19 0220 06/24/19 0522 06/24/19 [id number] 06/24/19 1448 06/24/19 1819 06/25/19 0008  HGB 15.6 16.3  --   --  14.3  --   --   --  12.7*  HCT 48.4 48.0  --   --  42.4  --   --   --  38.8*  PLT 139*  --   --   --  133*  --   --   --  122*  APTT 25  --   --   --   --   --   --   --   --   LABPROT 13.7  --   --   --   --   --   --   --   --   INR 1.1  --   --   --   --   --   --   --   --   HEPARINUNFRC  --   --   --   --   --  0.75* 0.52  --  0.30  CREATININE 1.56* 1.40*  --   --  1.59*  --   --   --  1.47*  TROPONINIHS  --   --    < > 79* 113*  --   --  104*  --    < > = values in this interval not displayed.    Estimated Creatinine Clearance: 25.4 mL/min (A) (by C-G formula based on SCr of 1.47 mg/dL (H)).   Medical History: Past Medical History:  Diagnosis Date  . Arthritis   . Cancer of prostate ([place])   . Joint pain   . Joint swelling     Medications:  Infusions:  . sodium chloride 250 mL (06/24/19 1603)  . heparin 800 Units/hr (06/24/19 1830)    Assessment: 16 yom presented to the ED as a code  stroke however, MRI was reported as negative for any acute findings. Found to be in afib and now starting IV heparin. Baseline Hgb is WNL and platelets are slightly low. He is not on anticoagulation PTA.   Heparin level drifting down to lower end therapeutic  Goal of Therapy:  Heparin level 0.3-0.7 units/ml Monitor platelets by anticoagulation protocol: Yes   Plan:  Increase heparin gtt to 850 units/hr F/u with AM labs  Bertis Ruddy, PharmD Clinical Pharmacist Please check AMION for all [place] numbers 06/25/2019 1:31 AM

## 2019-06-25 NOTE — Progress Notes (Signed)
[place] for heparin>> apixaban Indication: atrial fibrillation  Allergies  Allergen Reactions  . Penicillins Anaphylaxis, Hives and Swelling    Did it involve swelling of the face/tongue/throat, SOB, or low BP? Unk Did it involve sudden or severe rash/hives, skin peeling, or any reaction on the inside of your mouth or nose? Unk Did you need to seek medical attention at a hospital or doctor's office? No When did it last happen? Unk If all above answers are "NO", may proceed with cephalosporin use.     Patient Measurements: Height: 5\' 9"  (175.3 cm)(per pt) Weight: 144 lb 6.4 oz (65.5 kg) IBW/kg (Calculated) : 70.7 Heparin Dosing Weight: 64.7kg  Vital Signs: Temp: 99.1 F (37.3 C) (09/25 1153) Temp Source: Oral (09/25 1153) BP: 118/61 (09/25 1153) Pulse Rate: 88 (09/25 1153)  Labs: Recent Labs    06/23/19 1752 06/23/19 1810  06/24/19 0220 06/24/19 0522 06/24/19 0638 06/24/19 1448 06/24/19 1819 06/25/19 0008  HGB 15.6 16.3  --   --  14.3  --   --   --  12.7*  HCT 48.4 48.0  --   --  42.4  --   --   --  38.8*  PLT 139*  --   --   --  133*  --   --   --  122*  APTT 25  --   --   --   --   --   --   --   --   LABPROT 13.7  --   --   --   --   --   --   --   --   INR 1.1  --   --   --   --   --   --   --   --   HEPARINUNFRC  --   --   --   --   --  0.75* 0.52  --  0.30  CREATININE 1.56* 1.40*  --   --  1.59*  --   --   --  1.47*  TROPONINIHS  --   --    < > 79* 113*  --   --  104*  --    < > = values in this interval not displayed.    Estimated Creatinine Clearance: 26 mL/min (A) (by C-G formula based on SCr of 1.47 mg/dL (H)).   Medical History: Past Medical History:  Diagnosis Date  . Arthritis   . Cancer of prostate ([place])   . Joint pain   . Joint swelling     Medications:  Infusions:  . sodium chloride 250 mL (06/25/19 1159)    Assessment: 26 yom presented to the ED as a code stroke however, MRI was reported as  negative for any acute findings. Found to be in afib and was started on IV heparin.  He is not on anticoagulation PTA.   Pharmacy consulted to dose apixaban. Wt age and recent SCr > 2.5, lower dosing is indicated  Goal of Therapy:  Heparin level 0.3-0.7 units/ml Monitor platelets by anticoagulation protocol: Yes   Plan:  -Discontinue heparin  -Begin apixaban 2.5mg  po bid -Consider discontinuing ASA  Hildred Laser, PharmD Clinical Pharmacist **Pharmacist phone directory can now be found on [email or web address] ([email or web address]).  Listed under [place].

## 2019-06-25 NOTE — Procedures (Addendum)
Patient Name: Alan Rios  MRN: [id number]  Epilepsy Attending: Lora Havens  Referring Physician/Provider: Dr Kerney Elbe Date: 06/24/2019 Duration: 24.25 mins  Patient history: 85yo F with left sided weakness and unresponsiveness. EEG to evaluate for seizure.  Level of alertness: awake  AEDs during EEG study: None  Technical aspects: This EEG study was done with scalp electrodes positioned according to the 10-20 International system of electrode placement. Electrical activity was acquired at a sampling rate of 500Hz  and reviewed with a high frequency filter of 70Hz  and a low frequency filter of 1Hz . EEG data were recorded continuously and digitally stored.   DESCRIPTION: During awake state, no clear posterior dominant background was seen, There was continuous generalized 2-5Hz  theta-delta slowing. Photic driving was not seen during photic stimulation. Hyperventilation was not performed.  Patient had an additional study done later in the day with the same EEG findings.   IMPRESSION: This study is suggestive of mild to moderate diffuse encephalopathy, non specific to etiology. No seizures or epileptiform discharges were seen throughout the recording.  Alan Rios

## 2019-06-25 NOTE — Evaluation (Signed)
Occupational Therapy Evaluation Patient Details Name: Alan Rios MRN: [id number] DOB: 10/29/1918 Today's Date: 06/25/2019    History of Present Illness Pt is a 85 y/o male admitted after episode of unresponsiveness. MRI negative for acute abnormality. Found to have a fib with RVR. PMH includes prostate cancer and L TKA.    Clinical Impression   PTA Pt was mod I with SPC and RW PRN for mobility, still cooks, enjoys getting outside on porch. Son lives behind him. Today Pt is mod A for transfers (both HHA and with RW) he requires extra time for all movement (which is age appropriate) and is mod to max A for LB ADL, set up for seated UB ADL. Pt was able to stand for prolonged period of time for rear peri care (Pt had a BM in bed and was unaware). OT will continue to follow acutely. While I think that the best post-acute care would be short term SNF Stay, both the patient and his son are adamant about going home (respectfully) a granddaughter (who is an Therapist, sports) is also available and he will need strict 24 hour supervision initially with [place] and potentially an aide.     Follow Up Recommendations  Home health OT;Supervision/Assistance - 24 hour(strict supervision)    Equipment Recommendations  3 in 1 bedside commode;Tub/shower bench    Recommendations for Other Services Other (comment)([facility])     Precautions / Restrictions Precautions Precautions: Fall Precaution Comments: watch O2 Restrictions Weight Bearing Restrictions: No      Mobility Bed Mobility Overal bed mobility: Needs Assistance Bed Mobility: Supine to Sit;Sit to Supine     Supine to sit: Mod assist     General bed mobility comments: Pt able to bring BLE off the bed, and required mod A for trunk elevation, extra time to find seated balance  Transfers Overall transfer level: Needs assistance Equipment used: Rolling walker (2 wheeled);1 person hand held assist Transfers: Sit to/from [employer] Sit to Stand: Mod assist;From elevated surface Stand pivot transfers: Min assist       General transfer comment: Mod A for lift assist and steadying to stand from bed, then from recliner able to stand with mod A and extra time to get feet under himself and stand for approx 3 min for rear peri care cleaning    Balance Overall balance assessment: Needs assistance Sitting-balance support: No upper extremity supported;Feet supported Sitting balance-Leahy Scale: Fair(progressed to fair from poor)     Standing balance support: Bilateral upper extremity supported;During functional activity Standing balance-Leahy Scale: Poor Standing balance comment: Reliant on UE and external support                            ADL either performed or assessed with clinical judgement   ADL Overall ADL's : Needs assistance/impaired Eating/Feeding: Independent;Sitting   Grooming: Wash/dry hands;Wash/dry face;Set up;Sitting Grooming Details (indicate cue type and reason): in recliner Upper Body Bathing: Set up;Sitting   Lower Body Bathing: Moderate assistance;Sitting/lateral leans   Upper Body Dressing : Set up;Sitting   Lower Body Dressing: Moderate assistance;Sit to/from stand   Toilet Transfer: Minimal Production assistant, radio Details (indicate cue type and reason): one person HHA Toileting- Clothing Manipulation and Hygiene: Maximal assistance;Sit to/from stand Toileting - Clothing Manipulation Details (indicate cue type and reason): Pt able to stand and reach around for peri care, but needed max A for thorough cleaning     Functional mobility during ADLs:  Minimal assistance;Rolling walker General ADL Comments: Pt requires extra time for all movements - but he is slow and steady     Vision Patient Visual Report: No change from baseline       Perception     Praxis      Pertinent Vitals/Pain Pain Assessment: No/denies pain     Hand Dominance  Right   Extremity/Trunk Assessment Upper Extremity Assessment Upper Extremity Assessment: Generalized weakness   Lower Extremity Assessment Lower Extremity Assessment: Defer to PT evaluation   Cervical / Trunk Assessment Cervical / Trunk Assessment: Kyphotic   Communication Communication Communication: HOH   Cognition Arousal/Alertness: Awake/alert Behavior During Therapy: Flat affect Overall Cognitive Status: Within Functional Limits for tasks assessed                                 General Comments: Pt overall just requiring inceased time to perform   General Comments  son present throughout session, talked to him about strict 24 hour supervision initially once home - he said that his daughter and himself will be able to cover    Exercises     Shoulder Instructions      Home Living Family/patient expects to be discharged to:: Private residence Living Arrangements: Alone Available Help at Discharge: Family;Available PRN/intermittently Type of Home: House Home Access: Level entry     Home Layout: One level     Bathroom Shower/Tub: Teacher, early years/pre: Standard     Home Equipment: Environmental consultant - 2 wheels;Cane - single point          Prior Functioning/Environment Level of Independence: Independent with assistive device(s)        Comments: Reports using cane and RW        OT Problem List: Decreased activity tolerance;Impaired balance (sitting and/or standing);Decreased safety awareness;Decreased knowledge of use of DME or AE      OT Treatment/Interventions: Self-care/ADL training;DME and/or AE instruction;Therapeutic exercise;Therapeutic activities;Patient/family education;Balance training    OT Goals(Current goals can be found in the care plan section) Acute Rehab OT Goals Patient Stated Goal: to get home OT Goal Formulation: With patient/family Time For Goal Achievement: 07/09/19 Potential to Achieve Goals: Good ADL Goals Pt  Will Perform Grooming: with supervision;standing Pt Will Perform Upper Body Dressing: with modified independence;sitting Pt Will Perform Lower Body Dressing: with min guard assist;sitting/lateral leans;sit to/from stand Pt Will Transfer to Toilet: ambulating;with supervision Pt Will Perform Toileting - Clothing Manipulation and hygiene: with supervision;sit to/from stand Pt Will Perform Tub/Shower Transfer: Tub transfer;with min guard assist;with caregiver independent in assisting;ambulating;3 in 1;rolling walker  OT Frequency: Min 3X/week(Pt and family declining SNF)   Barriers to D/C:    Pt will require strict 24 hour supervision to be safe at home       Co-evaluation              AM-PAC OT "6 Clicks" Daily Activity     Outcome Measure Help from another person eating meals?: None Help from another person taking care of personal grooming?: A Little Help from another person toileting, which includes using toliet, bedpan, or urinal?: A Lot Help from another person bathing (including washing, rinsing, drying)?: A Lot Help from another person to put on and taking off regular upper body clothing?: A Little Help from another person to put on and taking off regular lower body clothing?: A Lot 6 Click Score: 16   End of Session Equipment  Utilized During Treatment: Gait belt;Rolling walker(gait belt that patient can take home) Nurse Communication: Mobility status  Activity Tolerance: Patient tolerated treatment well Patient left: in chair;with call bell/phone within reach;with chair alarm set;with family/visitor present  OT Visit Diagnosis: Unsteadiness on feet (R26.81);Other abnormalities of gait and mobility (R26.89);History of falling (Z91.81);Muscle weakness (generalized) (M62.81)                Time: 1445-1530 OT Time Calculation (min): 45 min Charges:  OT General Charges $OT Visit: 1 Visit OT Evaluation $OT Eval Moderate Complexity: 1 Mod OT Treatments $Self Care/Home  Management : 23-37 mins  Hulda Humphrey OTR/L Acute Rehabilitation Services Pager: [phone] Office: [place] 06/25/2019, 3:57 PM

## 2019-06-25 NOTE — Progress Notes (Signed)
Subjective: No complaints. Resting comfortably in bed.   Objective: Current vital signs: BP 118/61 (BP Location: Left Arm)   Pulse 88   Temp 99.1 F (37.3 C) (Oral)   Resp (!) 22   Ht 5\' 9"  (1.753 m) Comment: per pt  Wt 65.5 kg   SpO2 97%   BMI 21.32 kg/m  Vital signs in last 24 hours: Temp:  [98.2 F (36.8 C)-99.4 F (37.4 C)] 99.1 F (37.3 C) (09/25 1153) Pulse Rate:  [73-88] 88 (09/25 1153) Resp:  [16-22] 22 (09/25 1153) BP: (104-122)/(49-64) 118/61 (09/25 1153) SpO2:  [94 %-97 %] 97 % (09/25 1153) Weight:  [65.5 kg] 65.5 kg (09/25 0656)  Intake/Output from previous day: 09/24 0701 - 09/25 0700 In: 666.6 [P.O.:120; I.V.:546.6] Out: 850 [Urine:850] Intake/Output this shift: No intake/output data recorded. Nutritional status:  Diet Order            Diet Heart Room service appropriate? Yes; Fluid consistency: Thin  Diet effective now             HEENT: [place]/AT Lungs: Respirations unlabored Skin: No evidence for rash to hands, forearms, face or scalp.   Neurologic Exam: Ment: Awake and alert. Oriented to day, month, city and state, but not year. Speech fluent with intact comprehension. Somewhat slowed mentation, with mildly increased latencies of verbal and motor responses.  CN: Tracks and fixates normally. EOMI. Face symmetric.  Motor: 5/5 x 4  Lab Results: Results for orders placed or performed during the hospital encounter of 06/23/19 (from the past 48 hour(s))  Ethanol     Status: None   Collection Time: 06/23/19  5:52 PM  Result Value Ref Range   Alcohol, Ethyl (B) <10 <10 mg/dL    Comment: (NOTE) Lowest detectable limit for serum alcohol is 10 mg/dL. For medical purposes only. Performed at [facility], [place] [address], [place], [place] [address]   Protime-INR     Status: None   Collection Time: 06/23/19  5:52 PM  Result Value Ref Range   Prothrombin Time 13.7 11.4 - 15.2 seconds   INR 1.1 0.8 - 1.2    Comment: (NOTE) INR goal varies based  on device and disease states. Performed at [facility], [place] [address] [place], [place] [address]   APTT     Status: None   Collection Time: 06/23/19  5:52 PM  Result Value Ref Range   aPTT 25 24 - 36 seconds    Comment: Performed at [facility] [address], [place], [place] [address]  CBC     Status: Abnormal   Collection Time: 06/23/19  5:52 PM  Result Value Ref Range   WBC 6.3 4.0 - 10.5 K/uL   RBC 4.61 4.22 - 5.81 MIL/uL   Hemoglobin 15.6 13.0 - 17.0 g/dL   HCT 48.4 39.0 - 52.0 %   MCV 105.0 (H) 80.0 - 100.0 fL   MCH 33.8 26.0 - 34.0 pg   MCHC 32.2 30.0 - 36.0 g/dL   RDW 13.6 11.5 - 15.5 %   Platelets 139 (L) 150 - 400 K/uL   nRBC 0.0 0.0 - 0.2 %    Comment: Performed at [facility], [place] [address], [place], [place] [address]  Differential     Status: None   Collection Time: 06/23/19  5:52 PM  Result Value Ref Range   Neutrophils Relative % 34 %   Neutro Abs 2.2 1.7 - 7.7 K/uL   Lymphocytes Relative  54 %   Lymphs Abs 3.4 0.7 - 4.0 K/uL   Monocytes Relative 8 %   Monocytes Absolute 0.5 0.1 - 1.0 K/uL   Eosinophils Relative 3 %   Eosinophils Absolute 0.2 0.0 - 0.5 K/uL   Basophils Relative 1 %   Basophils Absolute 0.0 0.0 - 0.1 K/uL   Immature Granulocytes 0 %   Abs Immature Granulocytes 0.02 0.00 - 0.07 K/uL    Comment: Performed at [facility], [place] [address], [place], [place] [address]  Comprehensive metabolic panel     Status: Abnormal   Collection Time: 06/23/19  5:52 PM  Result Value Ref Range   Sodium 141 135 - 145 mmol/L   Potassium 3.6 3.5 - 5.1 mmol/L   Chloride 109 98 - 111 mmol/L   CO2 18 (L) 22 - 32 mmol/L   Glucose, Bld 129 (H) 70 - 99 mg/dL   BUN 24 (H) 8 - 23 mg/dL   Creatinine, Ser 1.56 (H) 0.61 - 1.24 mg/dL   Calcium 9.1 8.9 - 10.3 mg/dL   Total Protein 6.6 6.5 - 8.1 g/dL   Albumin 3.2 (L) 3.5 - 5.0 g/dL   AST 18 15 - 41 U/L   ALT 11 0 - 44 U/L   Alkaline Phosphatase 70 38 - 126 U/L   Total Bilirubin 0.4 0.3 -  1.2 mg/dL   GFR calc non Af Amer 36 (L) >60 mL/min   GFR calc Af Amer 42 (L) >60 mL/min   Anion gap 14 5 - 15    Comment: Performed at [facility] [address] [place], [place] [address]  I-stat chem 8, ED     Status: Abnormal   Collection Time: 06/23/19  6:10 PM  Result Value Ref Range   Sodium 140 135 - 145 mmol/L   Potassium 3.6 3.5 - 5.1 mmol/L   Chloride 112 (H) 98 - 111 mmol/L   BUN 30 (H) 8 - 23 mg/dL   Creatinine, Ser 1.40 (H) 0.61 - 1.24 mg/dL   Glucose, Bld 127 (H) 70 - 99 mg/dL   Calcium, Ion 0.95 (L) 1.15 - 1.40 mmol/L   TCO2 21 (L) 22 - 32 mmol/L   Hemoglobin 16.3 13.0 - 17.0 g/dL   HCT 48.0 39.0 - 52.0 %  Troponin I (High Sensitivity)     Status: Abnormal   Collection Time: 06/23/19  8:55 PM  Result Value Ref Range   Troponin I (High Sensitivity) 62 (H) <18 ng/L    Comment: (NOTE) Elevated high sensitivity troponin I (hsTnI) values and significant  changes across serial measurements may suggest ACS but many other  chronic and acute conditions are known to elevate hsTnI results.  Refer to the "Links" section for chest pain algorithms and additional  guidance. Performed at [facility], [place] [address] [place], [place] [address]   SARS CORONAVIRUS 2 (TAT 6-24 HRS) Nasopharyngeal Nasopharyngeal Swab     Status: None   Collection Time: 06/23/19 10:31 PM   Specimen: Nasopharyngeal Swab  Result Value Ref Range   SARS Coronavirus 2 NEGATIVE NEGATIVE    Comment: (NOTE) SARS-CoV-2 target nucleic acids are NOT DETECTED. The SARS-CoV-2 RNA is generally detectable in upper and lower respiratory specimens during the acute phase of infection. Negative results do not preclude SARS-CoV-2 infection, do not rule out co-infections with other pathogens, and should not be used as the sole basis for treatment or other patient management decisions. Negative results must be combined with clinical  observations, patient history, and epidemiological information. The  expected result is Negative. Fact Sheet for Patients: SugarRoll.be Fact Sheet for Healthcare Providers: https://www.woods-mathews.com/ This test is not yet approved or cleared by the [place] FDA and  has been authorized for detection and/or diagnosis of SARS-CoV-2 by FDA under an Emergency Use Authorization (EUA). This EUA will remain  in effect (meaning this test can be used) for the duration of the COVID-19 declaration under Section 56 4(b)(1) of the Act, 21 U.S.C. section 360bbb-3(b)(1), unless the authorization is terminated or revoked sooner. Performed at [facility], [place] [address], [place], [place] [address]   Urine rapid drug screen (hosp performed)     Status: None   Collection Time: 06/23/19 11:06 PM  Result Value Ref Range   Opiates NONE DETECTED NONE DETECTED   Cocaine NONE DETECTED NONE DETECTED   Benzodiazepines NONE DETECTED NONE DETECTED   Amphetamines NONE DETECTED NONE DETECTED   Tetrahydrocannabinol NONE DETECTED NONE DETECTED   Barbiturates NONE DETECTED NONE DETECTED    Comment: (NOTE) DRUG SCREEN FOR MEDICAL PURPOSES ONLY.  IF CONFIRMATION IS NEEDED FOR ANY PURPOSE, NOTIFY LAB WITHIN 5 DAYS. LOWEST DETECTABLE LIMITS FOR URINE DRUG SCREEN Drug Class                     Cutoff (ng/mL) Amphetamine and metabolites    1000 Barbiturate and metabolites    200 Benzodiazepine                 [id number] Tricyclics and metabolites     300 Opiates and metabolites        300 Cocaine and metabolites        300 THC                            50 Performed at [facility], [place] [address], [place], [place] [address]   Urinalysis, Routine w reflex microscopic     Status: Abnormal   Collection Time: 06/23/19 11:06 PM  Result Value Ref Range   Color, Urine YELLOW YELLOW   APPearance CLEAR CLEAR   Specific Gravity, Urine 1.014 1.005 - 1.030   pH 5.0 5.0 - 8.0   Glucose, UA NEGATIVE NEGATIVE mg/dL   Hgb urine  dipstick SMALL (A) NEGATIVE   Bilirubin Urine NEGATIVE NEGATIVE   Ketones, ur 5 (A) NEGATIVE mg/dL   Protein, ur NEGATIVE NEGATIVE mg/dL   Nitrite NEGATIVE NEGATIVE   Leukocytes,Ua NEGATIVE NEGATIVE   RBC / HPF 0-5 0 - 5 RBC/hpf   WBC, UA 0-5 0 - 5 WBC/hpf   Bacteria, UA RARE (A) NONE SEEN   Mucus PRESENT    Hyaline Casts, UA PRESENT     Comment: Performed at [facility] [address], [place], [place] [address]  TSH     Status: None   Collection Time: 06/23/19 11:26 PM  Result Value Ref Range   TSH 2.980 0.350 - 4.500 uIU/mL    Comment: Performed by a 3rd Generation assay with a functional sensitivity of <=0.01 uIU/mL. Performed at [facility], [place] [address], [place], [place] [address]   T4, free     Status: Abnormal   Collection Time: 06/23/19 11:26 PM  Result Value Ref Range   Free T4 1.19 (H) 0.61 - 1.12 ng/dL    Comment: (NOTE) Biotin ingestion may interfere with free T4 tests. If the results are inconsistent with the TSH  level, previous test results, or the clinical presentation, then consider biotin interference. If needed, order repeat testing after stopping biotin. Performed at [facility], [place] [address], [place], [place] [address]   Magnesium     Status: None   Collection Time: 06/24/19  2:20 AM  Result Value Ref Range   Magnesium 1.7 1.7 - 2.4 mg/dL    Comment: Performed at [facility], [place] [address] [place], [place] [address]  Troponin I (High Sensitivity)     Status: Abnormal   Collection Time: 06/24/19  2:20 AM  Result Value Ref Range   Troponin I (High Sensitivity) 79 (H) <18 ng/L    Comment: (NOTE) Elevated high sensitivity troponin I (hsTnI) values and significant  changes across serial measurements may suggest ACS but many other  chronic and acute conditions are known to elevate hsTnI results.  Refer to the "Links" section for chest pain algorithms and additional  guidance. Performed at [facility], [place]  [address] [place], [place] [address]   CBC     Status: Abnormal   Collection Time: 06/24/19  5:22 AM  Result Value Ref Range   WBC 11.9 (H) 4.0 - 10.5 K/uL   RBC 4.29 4.22 - 5.81 MIL/uL   Hemoglobin 14.3 13.0 - 17.0 g/dL   HCT 42.4 39.0 - 52.0 %   MCV 98.8 80.0 - 100.0 fL   MCH 33.3 26.0 - 34.0 pg   MCHC 33.7 30.0 - 36.0 g/dL   RDW 13.8 11.5 - 15.5 %   Platelets 133 (L) 150 - 400 K/uL   nRBC 0.0 0.0 - 0.2 %    Comment: Performed at [facility], [place] [address], [place], [place] [address]  Troponin I (High Sensitivity)     Status: Abnormal   Collection Time: 06/24/19  5:22 AM  Result Value Ref Range   Troponin I (High Sensitivity) 113 (HH) <18 ng/L    Comment: CRITICAL RESULT CALLED TO, READ BACK BY AND VERIFIED WITH: FLETCHER C,RN 06/24/19 [id number] Performed at [facility], [place] [address] [place], [place] [id number]   Basic metabolic panel     Status: Abnormal   Collection Time: 06/24/19  5:22 AM  Result Value Ref Range   Sodium 141 135 - 145 mmol/L   Potassium 3.7 3.5 - 5.1 mmol/L   Chloride 108 98 - 111 mmol/L   CO2 22 22 - 32 mmol/L   Glucose, Bld 125 (H) 70 - 99 mg/dL   BUN 23 8 - 23 mg/dL   Creatinine, Ser 1.59 (H) 0.61 - 1.24 mg/dL   Calcium 8.7 (L) 8.9 - 10.3 mg/dL   GFR calc non Af Amer 36 (L) >60 mL/min   GFR calc Af Amer 41 (L) >60 mL/min   Anion gap 11 5 - 15    Comment: Performed at [facility] [address] [place], [place] [address]  Heparin level (unfractionated)     Status: Abnormal   Collection Time: 06/24/19  6:38 AM  Result Value Ref Range   Heparin Unfractionated 0.75 (H) 0.30 - 0.70 IU/mL    Comment: (NOTE) If heparin results are below expected values, and patient dosage has  been confirmed, suggest follow up testing of antithrombin III levels. Performed at [facility], [place] [address] [place] [address] [place], [place] [address]   Heparin level (unfractionated)     Status: None   Collection Time: 06/24/19  2:48 PM  Result Value Ref  Range    Heparin Unfractionated 0.52 0.30 - 0.70 IU/mL    Comment: (NOTE) If heparin results are below expected values, and patient dosage has  been confirmed, suggest follow up testing of antithrombin III levels. Performed at [facility], [place] [address], [place], [place] [address]   Lipid panel     Status: None   Collection Time: 06/24/19  2:48 PM  Result Value Ref Range   Cholesterol 135 0 - 200 mg/dL   Triglycerides 35 <150 mg/dL   HDL 48 >40 mg/dL   Total CHOL/HDL Ratio 2.8 RATIO   VLDL 7 0 - 40 mg/dL   LDL Cholesterol 80 0 - 99 mg/dL    Comment:        Total Cholesterol/HDL:CHD Risk Coronary Heart Disease Risk Table                     Men   Women  1/2 Average Risk   3.4   3.3  Average Risk       5.0   4.4  2 X Average Risk   9.6   7.1  3 X Average Risk  23.4   11.0        Use the calculated Patient Ratio above and the CHD Risk Table to determine the patient's CHD Risk.        ATP III CLASSIFICATION (LDL):  <100     mg/dL   Optimal  100-129  mg/dL   Near or Above                    Optimal  130-159  mg/dL   Borderline  160-189  mg/dL   High  >190     mg/dL   Very High Performed at [facility] [address] [place], [place] [address]   Hemoglobin A1c     Status: Abnormal   Collection Time: 06/24/19  6:19 PM  Result Value Ref Range   Hgb A1c MFr Bld 5.7 (H) 4.8 - 5.6 %    Comment: (NOTE) Pre diabetes:          5.7%-6.4% Diabetes:              >6.4% Glycemic control for   <7.0% adults with diabetes    Mean Plasma Glucose 116.89 mg/dL    Comment: Performed at [facility] [address] [place], [place] [address]  Troponin I (High Sensitivity)     Status: Abnormal   Collection Time: 06/24/19  6:19 PM  Result Value Ref Range   Troponin I (High Sensitivity) 104 (HH) <18 ng/L    Comment: CRITICAL VALUE NOTED.  VALUE IS CONSISTENT WITH PREVIOUSLY REPORTED AND CALLED VALUE. (NOTE) Elevated high sensitivity troponin I (hsTnI) values and  significant  changes across serial measurements may suggest ACS but many other  chronic and acute conditions are known to elevate hsTnI results.  Refer to the Links section for chest pain algorithms and additional  guidance. Performed at [facility], [place] [address], [place], [place] [address]   Heparin level (unfractionated)     Status: None   Collection Time: 06/25/19 12:08 AM  Result Value Ref Range   Heparin Unfractionated 0.30 0.30 - 0.70 IU/mL    Comment: (NOTE) If heparin results are below expected values, and patient dosage has  been confirmed, suggest follow up testing of antithrombin III levels. Performed at [facility], [place] [address], [place], [place]  [id number]   CBC     Status: Abnormal   Collection Time: 06/25/19 12:08 AM  Result Value Ref Range   WBC 10.9 (H) 4.0 - 10.5 K/uL   RBC 3.84 (L) 4.22 - 5.81 MIL/uL   Hemoglobin 12.7 (L) 13.0 - 17.0 g/dL   HCT 38.8 (L) 39.0 - 52.0 %   MCV 101.0 (H) 80.0 - 100.0 fL   MCH 33.1 26.0 - 34.0 pg   MCHC 32.7 30.0 - 36.0 g/dL   RDW 14.5 11.5 - 15.5 %   Platelets 122 (L) 150 - 400 K/uL    Comment: REPEATED TO VERIFY   nRBC 0.0 0.0 - 0.2 %    Comment: Performed at [facility], [place] [address] [place], [place] [address]  Lipid panel     Status: None   Collection Time: 06/25/19 12:08 AM  Result Value Ref Range   Cholesterol 115 0 - 200 mg/dL   Triglycerides 40 <150 mg/dL   HDL 43 >40 mg/dL   Total CHOL/HDL Ratio 2.7 RATIO   VLDL 8 0 - 40 mg/dL   LDL Cholesterol 64 0 - 99 mg/dL    Comment:        Total Cholesterol/HDL:CHD Risk Coronary Heart Disease Risk Table                     Men   Women  1/2 Average Risk   3.4   3.3  Average Risk       5.0   4.4  2 X Average Risk   9.6   7.1  3 X Average Risk  23.4   11.0        Use the calculated Patient Ratio above and the CHD Risk Table to determine the patient's CHD Risk.        ATP III CLASSIFICATION (LDL):  <100     mg/dL   Optimal  100-129  mg/dL    Near or Above                    Optimal  130-159  mg/dL   Borderline  160-189  mg/dL   High  >190     mg/dL   Very High Performed at [facility] [address], [place], [place] [address]   Comprehensive metabolic panel     Status: Abnormal   Collection Time: 06/25/19 12:08 AM  Result Value Ref Range   Sodium 139 135 - 145 mmol/L   Potassium 4.1 3.5 - 5.1 mmol/L   Chloride 108 98 - 111 mmol/L   CO2 24 22 - 32 mmol/L   Glucose, Bld 123 (H) 70 - 99 mg/dL   BUN 23 8 - 23 mg/dL   Creatinine, Ser 1.47 (H) 0.61 - 1.24 mg/dL   Calcium 8.4 (L) 8.9 - 10.3 mg/dL   Total Protein 5.8 (L) 6.5 - 8.1 g/dL   Albumin 2.5 (L) 3.5 - 5.0 g/dL   AST 23 15 - 41 U/L   ALT 11 0 - 44 U/L   Alkaline Phosphatase 52 38 - 126 U/L   Total Bilirubin 1.1 0.3 - 1.2 mg/dL   GFR calc non Af Amer 39 (L) >60 mL/min   GFR calc Af Amer 45 (L) >60 mL/min   Anion gap 7 5 - 15    Comment: Performed at [facility] [address], [place], [place] [address]  Magnesium     Status: None   Collection Time:  06/25/19 12:08 AM  Result Value Ref Range   Magnesium 2.3 1.7 - 2.4 mg/dL    Comment: Performed at [facility], [place] [address], [place], [place] [address]    Recent Results (from the past 240 hour(s))  SARS CORONAVIRUS 2 (TAT 6-24 HRS) Nasopharyngeal Nasopharyngeal Swab     Status: None   Collection Time: 06/23/19 10:31 PM   Specimen: Nasopharyngeal Swab  Result Value Ref Range Status   SARS Coronavirus 2 NEGATIVE NEGATIVE Final    Comment: (NOTE) SARS-CoV-2 target nucleic acids are NOT DETECTED. The SARS-CoV-2 RNA is generally detectable in upper and lower respiratory specimens during the acute phase of infection. Negative results do not preclude SARS-CoV-2 infection, do not rule out co-infections with other pathogens, and should not be used as the sole basis for treatment or other patient management decisions. Negative results must be combined with clinical observations, patient  history, and epidemiological information. The expected result is Negative. Fact Sheet for Patients: SugarRoll.be Fact Sheet for Healthcare Providers: https://www.woods-mathews.com/ This test is not yet approved or cleared by the [place] FDA and  has been authorized for detection and/or diagnosis of SARS-CoV-2 by FDA under an Emergency Use Authorization (EUA). This EUA will remain  in effect (meaning this test can be used) for the duration of the COVID-19 declaration under Section 56 4(b)(1) of the Act, 21 U.S.C. section 360bbb-3(b)(1), unless the authorization is terminated or revoked sooner. Performed at [facility], [place] [address], [place], [place] [address]     Lipid Panel Recent Labs    06/25/19 0008  CHOL 115  TRIG 40  HDL 43  CHOLHDL 2.7  VLDL 8  LDLCALC 64    Studies/Results: Mr Angio Head Wo Contrast  Result Date: 06/24/2019 CLINICAL DATA:  Focal neuro deficit, greater than 6 hours, stroke suspected. EXAM: MRA HEAD WITHOUT CONTRAST TECHNIQUE: Angiographic images of the Circle of Willis were obtained using MRA technique without intravenous contrast. COMPARISON:  Brain MRI performed 06/23/2019, head CT 06/23/2019, MRA 11/12/2013 FINDINGS: The bilateral carotid artery siphons are patent without high-grade stenosis. The right middle and anterior cerebral arteries are patent without high-grade proximal stenosis. Mild to moderate stenosis within the proximal M1 right MCA. Question mild stenosis at the origin of the right ACA. The left middle and anterior cerebral arteries are patent without high-grade proximal stenosis. 1-2 mm tiny aneurysm versus infundibulum projecting inferiorly from the supraclinoid right ICA (series 2, image 106). The imaged intracranial vertebral arteries are patent without significant stenosis, as is the basilar artery. The bilateral posterior cerebral arteries are patent. Unchanged moderate stenosis  within the P2 right PCA. Multifocal moderate stenosis within the P2 left PCA have progressed from prior exam. IMPRESSION: 1. No intracranial large vessel occlusion. 2. Mild to moderate stenosis within the proximal M1 right MCA. 3. Moderate stenosis within the P2 right PCA. 4. Multifocal moderate stenoses within the P2 left PCA. 5. 1-2 mm tiny aneurysm versus infundibulum of the supraclinoid right ICA. Electronically Signed   By: Kellie Simmering   On: 06/24/2019 18:53   Mr Brain Wo Contrast (neuro Protocol)  Result Date: 06/23/2019 CLINICAL DATA:  Altered mental status. Weakness. Vomiting. Last seen normal 1500 hours. EXAM: MRI HEAD WITHOUT CONTRAST TECHNIQUE: Multiplanar, multiecho pulse sequences of the brain and surrounding structures were obtained without intravenous contrast. COMPARISON:  Head CT same day.  MRI 11/12/2013 FINDINGS: Brain: Diffusion imaging does not show any acute or subacute infarction. The brainstem and cerebellum are normal. Cerebral  hemispheres show confluent chronic small vessel ischemic changes of the deep and subcortical white matter. No cortical or large vessel territory infarction. No mass lesion, hemorrhage, hydrocephalus or extra-axial collection. Vascular: Major vessels at the base of the brain show flow. Skull and upper cervical spine: Negative Sinuses/Orbits: Clear/normal Other: None IMPRESSION: No acute finding. Age related atrophy. Chronic small-vessel ischemic changes throughout the cerebral hemispheric white matter. White matter changes are slightly progressive since 2017. Electronically Signed   By: Nelson Chimes M.D.   On: 06/23/2019 20:22   Dg Chest Port 1 View  Result Date: 06/23/2019 CLINICAL DATA:  Weakness EXAM: PORTABLE CHEST 1 VIEW COMPARISON:  11/12/2013 FINDINGS: Heart and mediastinal contours are within normal limits. No focal opacities or effusions. No acute bony abnormality. IMPRESSION: No active disease. Electronically Signed   By: Rolm Baptise M.D.   On:  06/23/2019 18:59   Ct Head Code Stroke Wo Contrast  Result Date: 06/23/2019 CLINICAL DATA:  Code stroke.  Unresponsive. EXAM: CT HEAD WITHOUT CONTRAST TECHNIQUE: Contiguous axial images were obtained from the base of the skull through the vertex without intravenous contrast. COMPARISON:  11/12/2018 FINDINGS: Brain: Extensive chronic small-vessel ischemic changes of the cerebral hemispheric white matter. No CT evidence of acute infarction, mass lesion, hemorrhage, hydrocephalus or extra-axial collection. Vascular: There is atherosclerotic calcification of the major vessels at the base of the brain. Skull: Negative Sinuses/Orbits: Clear/normal Other: None ASPECTS ([place] Stroke Program Early CT Score) - Ganglionic level infarction (caudate, lentiform nuclei, internal capsule, insula, M1-M3 cortex): 7 - Supraganglionic infarction (M4-M6 cortex): 3 Total score (0-10 with 10 being normal): 10 IMPRESSION: 1. No acute finding by CT. Chronic small-vessel ischemic changes of the cerebral hemispheric white matter. 2. ASPECTS is 10 3. These results were communicated to Dr. Cheral Marker at [facility] 9/23/2020by text page via the [facility] messaging system. Electronically Signed   By: Nelson Chimes M.D.   On: 06/23/2019 18:14   Vas US Carotid  Result Date: 06/24/2019 Carotid Arterial Duplex Study Indications:       Syncope. Other Factors:     Atrial fibrillation with RVR, prostate cancer. Comparison Study:  No prior study Performing Technologist: Sharion Dove RVS  Examination Guidelines: A complete evaluation includes B-mode imaging, spectral Doppler, color Doppler, and power Doppler as needed of all accessible portions of each vessel. Bilateral testing is considered an integral part of a complete examination. Limited examinations for reoccurring indications may be performed as noted.  Right Carotid Findings: +----------+--------+--------+--------+------------------+--------+           PSV cm/sEDV cm/sStenosisPlaque  DescriptionComments +----------+--------+--------+--------+------------------+--------+ CCA Prox  107     12              heterogenous               +----------+--------+--------+--------+------------------+--------+ CCA Distal73      9               homogeneous                +----------+--------+--------+--------+------------------+--------+ ICA Prox  77      17              homogeneous                +----------+--------+--------+--------+------------------+--------+ ICA Distal75      12                                         +----------+--------+--------+--------+------------------+--------+  ECA       146     3                                          +----------+--------+--------+--------+------------------+--------+ +----------+--------+-------+--------+-------------------+           PSV cm/sEDV cmsDescribeArm Pressure (mmHG) +----------+--------+-------+--------+-------------------+ [id number]                                        +----------+--------+-------+--------+-------------------+ +---------+--------+---+--------+--+ VertebralPSV cm/s101EDV cm/s10 +---------+--------+---+--------+--+  Left Carotid Findings: +----------+--------+--------+--------+------------------+------------------+           PSV cm/sEDV cm/sStenosisPlaque DescriptionComments           +----------+--------+--------+--------+------------------+------------------+ CCA Prox  118     10                                intimal thickening +----------+--------+--------+--------+------------------+------------------+ CCA Distal90      10                                intimal thickening +----------+--------+--------+--------+------------------+------------------+ ICA Prox  101     15              homogeneous       Shadowing          +----------+--------+--------+--------+------------------+------------------+ ICA Distal101     16                                                    +----------+--------+--------+--------+------------------+------------------+ ECA       112     2                                                    +----------+--------+--------+--------+------------------+------------------+ +----------+--------+--------+--------+-------------------+           PSV cm/sEDV cm/sDescribeArm Pressure (mmHG) +----------+--------+--------+--------+-------------------+ Subclavian213                                         +----------+--------+--------+--------+-------------------+ +---------+--------+--+--------+-+ VertebralPSV cm/s69EDV cm/s6 +---------+--------+--+--------+-+  Summary: Right Carotid: Velocities in the right ICA are consistent with a 1-39% stenosis. Left Carotid: Velocities in the left ICA are consistent with a 1-39% stenosis. Vertebrals:  Bilateral vertebral arteries demonstrate antegrade flow. Subclavians: Normal flow hemodynamics were seen in bilateral subclavian              arteries. *See table(s) above for measurements and observations.     Preliminary     Medications:  Scheduled: . apixaban  2.5 mg Oral BID  . atorvastatin  40 mg Oral q1800  . bethanechol  25 mg Oral BID  . metoprolol tartrate  12.5 mg Oral BID  . tamsulosin  0.4 mg Oral BID WC   Continuous: . sodium chloride 250 mL (06/25/19 1159)   EEG: This study is suggestive of mild to  moderate diffuse encephalopathy, non specific to etiology. No seizures or epileptiform discharges were seen throughout the recording.  Assessment: 85 year old male presenting with unresponsiveness and evidence for vomiting, with left sided weakness noted by EMS 1. Left sided weakness and unresponsiveness had resolved by the time CT was being performed in the ED. He was noted to be improving from arrival at the bridge to completion of CT. He now is conversing relatively normally, but with mildly increased latency of motor and verbal responses and possibly  with some mild confusion. This may be his baseline. Would clarify with family whether or not he has returned to baseline. If this is his normal level of cognitive functioning, then he may have a mild dementia.  2. Stroke/TIA work up completed. Pertinent findings consist of new diagnosis of atrial fibrillation and intracranial/extracranial atherosclerotic changes on MRA head and carotid ultrasound. No acute stroke was seen on MRI brain, but age-related atrophy and chronic small-vessel ischemic changes, slightly progressive since 2017, were noted (see item #1, above).  3. EEG with findings c/w moderate diffuse encephalopathy, nonspecific. No epileptiform discharges were seen.   Recommendations: 1. No indication for starting an anticonvulsant. 2. Agree with starting Apixaban for stroke and MI prevention given newly diagnosed atrial fibrillation.  3. Will need outpatient Neurology follow up for cognitive evaluation (possible mild dementia).  4. Neurology will sign off. Please call if there are additional questions.     LOS: 1 day   @Electronically  signed: Dr. Kerney Elbe 06/25/2019  1:28 PM

## 2019-06-25 NOTE — Discharge Summary (Signed)
Physician Discharge Summary  Alan Rios [id number] DOB: 10/08/1918 DOA: 06/23/2019  PCP: [facility] date: 06/23/2019 Discharge date: 06/25/2019  Time spent: 40 minutes  Recommendations for Outpatient Follow-up:  1. Follow outpatient CBC/CMP (attention to platelets) 2. Follow with outpatient PCP for new onset atrial fibrillation - started on eliquis and metoprolol 3. Follow with outpatient neurologist - concern for mild dementia - recommending cognitive evaluation 4. Consider palliative care as outpatient (pt was full code while admitted) 5. Follow repeat thyroid studies outpatient 6. Follow final carotid US report  Discharge Diagnoses:  Principal Problem:   Atrial fibrillation with rapid ventricular response (HCC) Active Problems:   Syncope   QT prolongation   AKI (acute kidney injury) ([place])   Thrombocytopenia ([place])   Discharge Condition: stable  Diet recommendation: heart healthy  Filed Weights   06/23/19 2321 06/24/19 0331 06/25/19 0656  Weight: 63.9 kg 63.9 kg 65.5 kg    History of present illness:  Alan Rios 85 y.o.malewith medical history significant ofarthritis, prostate cancer presenting the hospital via EMS for evaluation of unresponsiveness.Upon EMS arrival, patient had repetitive answers to questions and had difficulty following commands. Found to be in Alan Rios. fib with RVR with rate ranging from 150-170.Patient states he was doing yard work and then just sat down because he was not feeling good. He then remembers waking up.Denies any chest pain, shortness of breath, heart palpitations, or lightheadedness/dizziness. He has no other complaints. Denies any recent illness, fevers, or chills. Denies abdominal pain or diarrhea. Son at bedside states he lives very close to the patient and checks on him several times Alan Rios day. Today patient was doing yard work and his son had gone somewhere. When the son returned he  noticed that the patient was sitting on Alan Rios chair with his eyes closed. Patient opened his eyes after his son shook him and started vomiting. He was then just staring and not responding. Son confirms that patient has not had any recent illness. He has not had any fevers. He has not complained of any chest pain or shortness of breath. He does not have Alan Rios history of seizures.  ED Course:Found to be in Alan Rios. fib with RVR with rate in the 150s. Blood pressure stable. Patient received IV Cardizem 15 mg total in the ED. Afebrile and no leukocytosis. Not tachypneic or hypoxic. Platelet count 139,000, chronically low. Blood ethanol level negative. Bicarb 18, anion gap 14. Blood glucose 129. BUN 24, creatinine 1.5. Baseline creatinine 1.0-1.1. UDS pending. UA pending. High-sensitivity troponin pending. Chest x-ray showing no active disease. Head CT negative for acute finding. Brain MRI negative for acute finding.  He was admitted for episode of AMS concerning for syncope vs TIA vs seizure (pt with L sided weakness noted by EMS which was resolved when pt was in hospital).  He was found to have new onset Alan Rios fib and was started on metoprolol and eliquis.  His EEG did not show evidence of seizures and his MRI was without findings c/w stroke.  PT recommended SNF, but pt and son declined this.  Discharged home with home health.  Hospital Course:  New onset Alan Rios. fib with RVR/ arrhythmia Patient initially found to be Alan Rios. fib with RVR with rate ranging from 150-170.  Initially received dilt, but transitioned to amiodarone based on cardiology recommendations (curbside by admitting provider) EKG 9/24, pt has converted to sinus S/p amiodarone - d/c this Will discharge with metoprolol Started on eliquis for chads vasc  of 2.  Will use 2.5 mg dosing due to age and borderline renal function. TSH wnl (free T4 slightly elevated) Echo with normal EF (see report) A1c (prediabetes 5.7), lipid panel (LDL 64, HDL  43) Discussed risk/benefits of eliquis with family.  Discussed risks of stroke without anticoagulation with afib.  Discussed if pt has issues with falling, would need to revisit conversation of risk/benefits.  Syncope  Acute Encephalopathy  Left Sided Weakness Seen by neurology recommending TIA workup and EEG for r/o seizures Follow MRA no intracranial large vessel occlusion, mild to mod stenosis within proximal M1 R MCA, moderate stenosis within the P2 right PCA, multifocal moderate stenoses within the P2 L PCA, 1-2 mm tiny aneurysm vs infundibulum of the supraclinoid R ICA MRI without acute stroke EEG with mild to moderate diffuse encephalopathy R ICA with 1-39% stenosis, L ICA with 1-39% stenosis, bilateral vertebral arteries with antegrade flow PT/OT/SLP - recommending SNF vs 24 hr care - pt and son decline this option Troponin elevated, see below Appreciate neurology recs - recommend outpatient neurology f/u for cognitive evaluation  Troponin Elevation Suspect this may be demand ischemia related to his afib with RVR EKG at presentation with afib with RVR.  9/24 AM EKG with T wave inversions from I, aVL,and V2-V6.  Isolated in II.  -> 9/24 PM EKG was improved. High sensitivity troponin was elevated, but he denies CP and suspect was 2/2 his RVR Troponin 62 -> 79 -> 113 -> 104.  Echo without report of wall motion abnormality and with normal EF. Suspect troponin elevation was related to demand from pt afib, low suspicion for ACS  QTC prolongation on EKG Improved at 435   CKD III BUN 24, creatinine 1.5.  Baseline creatinine 1.0-1.1. in 2016 Continue IVF hydration This maybe his recent baseline  Chronic thrombocytopenia Platelet count 139,000, chronically low.No signs of active bleeding. -Continue to monitor, relatively stable today  Chronic pain -Continue home Norco PRN  Abnormal TFT: follow outpatient  Procedures: IMPRESSIONS    1. Left ventricular ejection  fraction, by visual estimation, is 60 to 65%. The left ventricle has normal function. Normal left ventricular size. There is mildly increased left ventricular hypertrophy.  2. Left ventricular diastolic Doppler parameters are consistent with impaired relaxation pattern of LV diastolic filling.  3. Global right ventricle has normal systolic function.The right ventricular size is normal. No increase in right ventricular wall thickness.  4. Left atrial size was mildly dilated.  5. Right atrial size was mildly dilated.  6. The mitral valve is normal in structure. Trace mitral valve regurgitation. No evidence of mitral stenosis.  7. The tricuspid valve is normal in structure. Tricuspid valve regurgitation is trivial.  8. The aortic valve is tricuspid Aortic valve regurgitation is mild by color flow Doppler. Mild aortic valve sclerosis without stenosis.  9. The pulmonic valve was normal in structure. Pulmonic valve regurgitation is mild by color flow Doppler. 10. The tricuspid regurgitant velocity is 2.20 m/s, and with an assumed right atrial pressure of 3 mmHg, the estimated right ventricular systolic pressure is normal at 22.4 mmHg. 11. The inferior vena cava is normal in size with greater than 50% respiratory variability, suggesting right atrial pressure of 3 mmHg.  Carotid doppler - prelim Summary: Right Carotid: Velocities in the right ICA are consistent with Cristela Stalder 1-39% stenosis.  Left Carotid: Velocities in the left ICA are consistent with Otilia Kareem 1-39% stenosis.  Vertebrals:  Bilateral vertebral arteries demonstrate antegrade flow. Subclavians: Normal flow hemodynamics were  seen in bilateral subclavian              arteries.  *See table(s) above for measurements and observations.  EEG IMPRESSION: This study is suggestive of mild to moderate diffuse encephalopathy, non specific to etiology. No seizures or epileptiform discharges were seen throughout the  recording.  Consultations:  neurology  Discharge Exam: Vitals:   06/25/19 0934 06/25/19 1153  BP:  118/61  Pulse: 76 88  Resp: 18 (!) 22  Temp:  99.1 F (37.3 C)  SpO2: 96% 97%   Would like to go home Feeling almost to his baseline Son at bedside, discussed d/c plan  General: No acute distress. Cardiovascular: Heart sounds show Laquetta Racey regular rate, and rhythm.  Lungs: Clear to auscultation bilaterally  Abdomen: Soft, nontender, nondistended  Neurological: Alert and oriented 3. Moves all extremities 4. Cranial nerves II through XII grossly intact. Skin: Warm and dry. No rashes or lesions. Extremities: No clubbing or cyanosis. No edema.   Discharge Instructions   Discharge Instructions    Ambulatory referral to Neurology   Complete by: As directed    An appointment is requested in approximately: 2 weeks   Call MD for:  difficulty breathing, headache or visual disturbances   Complete by: As directed    Call MD for:  extreme fatigue   Complete by: As directed    Call MD for:  hives   Complete by: As directed    Call MD for:  persistant dizziness or light-headedness   Complete by: As directed    Call MD for:  persistant nausea and vomiting   Complete by: As directed    Call MD for:  redness, tenderness, or signs of infection (pain, swelling, redness, odor or green/yellow discharge around incision site)   Complete by: As directed    Call MD for:  severe uncontrolled pain   Complete by: As directed    Call MD for:  temperature >100.4   Complete by: As directed    Diet - low sodium heart healthy   Complete by: As directed    Diet - low sodium heart healthy   Complete by: As directed    Discharge instructions   Complete by: As directed    You were seen for an episode of loss of consciousness and new onset atrial fibrillation.  Neurology was consulted because you had left sided weakness, but the workup for stroke was negative.  The workup for seizures was also negative.   We'll recommend neurology follow up outpatient.  You had new onset atrial fibrillation.  This puts you at risk for stroke.  We've started you on Jong Rickman blood thinner, eliquis.  This will put you at increased risk for bleeding.  If you have issues with falls that develop, then Kyi Romanello discussion should be had about whether this should be continued.  You had an elevated troponin (heart enzyme).  This can be seen when the heart is under stress or is pumping fast (like for your atrial fibrillation).  Please follow up with your outpatient PCP.  We've started you on pravastatin and metoprolol.   Your platelets are slightly low.  Please follow outpatient.   Return for new, recurrent, or worsening symptoms.  Please ask your PCP to request records from this hospitalization so they know what was done and what the next steps will be.   Increase activity slowly   Complete by: As directed      Allergies as of 06/25/2019  Reactions   Penicillins Anaphylaxis, Hives, Swelling   Did it involve swelling of the face/tongue/throat, SOB, or low BP? Unk Did it involve sudden or severe rash/hives, skin peeling, or any reaction on the inside of your mouth or nose? Unk Did you need to seek medical attention at [facility] or doctor's office? No When did it last happen? Unk If all above answers are "NO", may proceed with cephalosporin use.      Medication List    STOP taking these medications   aspirin EC 325 MG tablet   aspirin EC 81 MG tablet     TAKE these medications   apixaban 2.5 MG Tabs tablet Commonly known as: ELIQUIS Take 1 tablet (2.5 mg total) by mouth 2 (two) times daily.   bethanechol 25 MG tablet Commonly known as: URECHOLINE Take 25 mg by mouth 2 (two) times daily.   HYDROcodone-acetaminophen 5-325 MG tablet Commonly known as: Norco Take 1 tablet by mouth every 6 (six) hours as needed. What changed: reasons to take this   metoprolol tartrate 25 MG tablet Commonly known as:  LOPRESSOR Take 0.5 tablets (12.5 mg total) by mouth 2 (two) times daily.   pravastatin 20 MG tablet Commonly known as: Pravachol Take 1 tablet (20 mg total) by mouth every evening.   tamsulosin 0.4 MG Caps capsule Commonly known as: FLOMAX Take 0.4 mg by mouth 2 (two) times daily with Kristiana Jacko meal.   tizanidine 2 MG capsule Commonly known as: Zanaflex Take 1 capsule (2 mg total) by mouth 3 (three) times daily.   VITAMIN B12 PO Take 1 tablet by mouth daily.            Durable Medical Equipment  (From admission, onward)         Start     Ordered   06/25/19 1610  DME 3-in-1  Once     06/25/19 1609   06/25/19 1610  DME tub bench  Once     06/25/19 1609         Allergies  Allergen Reactions  . Penicillins Anaphylaxis, Hives and Swelling    Did it involve swelling of the face/tongue/throat, SOB, or low BP? Unk Did it involve sudden or severe rash/hives, skin peeling, or any reaction on the inside of your mouth or nose? Unk Did you need to seek medical attention at [facility] or doctor's office? No When did it last happen? Unk If all above answers are "NO", may proceed with cephalosporin use.    [facility] Follow up.   Specialty: Home Health Services Why: Registered Nurse, Physical and Occupational Therapies, Aide, Social Worker-offie to call you with Amayia Ciano visit time.  Contact information: [place] [address] [facility] Follow up.   Why: Bedside Commode- to be delivered to room prior to transition home.  Contact information: [place] [address] [phone]            The results of significant diagnostics from this hospitalization (including imaging, microbiology, ancillary and laboratory) are listed below for reference.    Significant Diagnostic Studies: Mr Angio Head Wo Contrast  Result Date: 06/24/2019 CLINICAL DATA:  Focal  neuro deficit, greater than 6 hours, stroke suspected. EXAM: MRA HEAD WITHOUT CONTRAST TECHNIQUE: Angiographic images of the Circle of Willis were obtained using MRA technique without intravenous contrast. COMPARISON:  Brain MRI performed 06/23/2019,  head CT 06/23/2019, MRA 11/12/2013 FINDINGS: The bilateral carotid artery siphons are patent without high-grade stenosis. The right middle and anterior cerebral arteries are patent without high-grade proximal stenosis. Mild to moderate stenosis within the proximal M1 right MCA. Question mild stenosis at the origin of the right ACA. The left middle and anterior cerebral arteries are patent without high-grade proximal stenosis. 1-2 mm tiny aneurysm versus infundibulum projecting inferiorly from the supraclinoid right ICA (series 2, image 106). The imaged intracranial vertebral arteries are patent without significant stenosis, as is the basilar artery. The bilateral posterior cerebral arteries are patent. Unchanged moderate stenosis within the P2 right PCA. Multifocal moderate stenosis within the P2 left PCA have progressed from prior exam. IMPRESSION: 1. No intracranial large vessel occlusion. 2. Mild to moderate stenosis within the proximal M1 right MCA. 3. Moderate stenosis within the P2 right PCA. 4. Multifocal moderate stenoses within the P2 left PCA. 5. 1-2 mm tiny aneurysm versus infundibulum of the supraclinoid right ICA. Electronically Signed   By: Kellie Simmering   On: 06/24/2019 18:53   Mr Brain Wo Contrast (neuro Protocol)  Result Date: 06/23/2019 CLINICAL DATA:  Altered mental status. Weakness. Vomiting. Last seen normal 1500 hours. EXAM: MRI HEAD WITHOUT CONTRAST TECHNIQUE: Multiplanar, multiecho pulse sequences of the brain and surrounding structures were obtained without intravenous contrast. COMPARISON:  Head CT same day.  MRI 11/12/2013 FINDINGS: Brain: Diffusion imaging does not show any acute or subacute infarction. The brainstem and cerebellum are  normal. Cerebral hemispheres show confluent chronic small vessel ischemic changes of the deep and subcortical white matter. No cortical or large vessel territory infarction. No mass lesion, hemorrhage, hydrocephalus or extra-axial collection. Vascular: Major vessels at the base of the brain show flow. Skull and upper cervical spine: Negative Sinuses/Orbits: Clear/normal Other: None IMPRESSION: No acute finding. Age related atrophy. Chronic small-vessel ischemic changes throughout the cerebral hemispheric white matter. White matter changes are slightly progressive since 2017. Electronically Signed   By: Nelson Chimes M.D.   On: 06/23/2019 20:22   Dg Chest Port 1 View  Result Date: 06/23/2019 CLINICAL DATA:  Weakness EXAM: PORTABLE CHEST 1 VIEW COMPARISON:  11/12/2013 FINDINGS: Heart and mediastinal contours are within normal limits. No focal opacities or effusions. No acute bony abnormality. IMPRESSION: No active disease. Electronically Signed   By: Rolm Baptise M.D.   On: 06/23/2019 18:59   Ct Head Code Stroke Wo Contrast  Result Date: 06/23/2019 CLINICAL DATA:  Code stroke.  Unresponsive. EXAM: CT HEAD WITHOUT CONTRAST TECHNIQUE: Contiguous axial images were obtained from the base of the skull through the vertex without intravenous contrast. COMPARISON:  11/12/2018 FINDINGS: Brain: Extensive chronic small-vessel ischemic changes of the cerebral hemispheric white matter. No CT evidence of acute infarction, mass lesion, hemorrhage, hydrocephalus or extra-axial collection. Vascular: There is atherosclerotic calcification of the major vessels at the base of the brain. Skull: Negative Sinuses/Orbits: Clear/normal Other: None ASPECTS ([place] Stroke Program Early CT Score) - Ganglionic level infarction (caudate, lentiform nuclei, internal capsule, insula, M1-M3 cortex): 7 - Supraganglionic infarction (M4-M6 cortex): 3 Total score (0-10 with 10 being normal): 10 IMPRESSION: 1. No acute finding by CT. Chronic  small-vessel ischemic changes of the cerebral hemispheric white matter. 2. ASPECTS is 10 3. These results were communicated to Dr. Cheral Marker at [place] 9/23/2020by text page via the [facility] messaging system. Electronically Signed   By: Nelson Chimes M.D.   On: 06/23/2019 18:14   Vas US Carotid  Result Date: 06/24/2019 Carotid Arterial Duplex Study  Indications:       Syncope. Other Factors:     Atrial fibrillation with RVR, prostate cancer. Comparison Study:  No prior study Performing Technologist: Sharion Dove RVS  Examination Guidelines: Calysta Craigo complete evaluation includes B-mode imaging, spectral Doppler, color Doppler, and power Doppler as needed of all accessible portions of each vessel. Bilateral testing is considered an integral part of Gualberto Wahlen complete examination. Limited examinations for reoccurring indications may be performed as noted.  Right Carotid Findings: +----------+--------+--------+--------+------------------+--------+           PSV cm/sEDV cm/sStenosisPlaque DescriptionComments +----------+--------+--------+--------+------------------+--------+ CCA Prox  107     12              heterogenous               +----------+--------+--------+--------+------------------+--------+ CCA Distal73      9               homogeneous                +----------+--------+--------+--------+------------------+--------+ ICA Prox  77      17              homogeneous                +----------+--------+--------+--------+------------------+--------+ ICA Distal75      12                                         +----------+--------+--------+--------+------------------+--------+ ECA       146     3                                          +----------+--------+--------+--------+------------------+--------+ +----------+--------+-------+--------+-------------------+           PSV cm/sEDV cmsDescribeArm Pressure (mmHG) +----------+--------+-------+--------+-------------------+  [id number]                                        +----------+--------+-------+--------+-------------------+ +---------+--------+---+--------+--+ VertebralPSV cm/s101EDV cm/s10 +---------+--------+---+--------+--+  Left Carotid Findings: +----------+--------+--------+--------+------------------+------------------+           PSV cm/sEDV cm/sStenosisPlaque DescriptionComments           +----------+--------+--------+--------+------------------+------------------+ CCA Prox  118     10                                intimal thickening +----------+--------+--------+--------+------------------+------------------+ CCA Distal90      10                                intimal thickening +----------+--------+--------+--------+------------------+------------------+ ICA Prox  101     15              homogeneous       Shadowing          +----------+--------+--------+--------+------------------+------------------+ ICA Distal101     16                                                   +----------+--------+--------+--------+------------------+------------------+ ECA  112     2                                                    +----------+--------+--------+--------+------------------+------------------+ +----------+--------+--------+--------+-------------------+           PSV cm/sEDV cm/sDescribeArm Pressure (mmHG) +----------+--------+--------+--------+-------------------+ Subclavian213                                         +----------+--------+--------+--------+-------------------+ +---------+--------+--+--------+-+ VertebralPSV cm/s69EDV cm/s6 +---------+--------+--+--------+-+  Summary: Right Carotid: Velocities in the right ICA are consistent with Kamen Hanken 1-39% stenosis. Left Carotid: Velocities in the left ICA are consistent with Marcin Holte 1-39% stenosis. Vertebrals:  Bilateral vertebral arteries demonstrate antegrade flow. Subclavians: Normal flow hemodynamics  were seen in bilateral subclavian              arteries. *See table(s) above for measurements and observations.     Preliminary     Microbiology: Recent Results (from the past 240 hour(s))  SARS CORONAVIRUS 2 (TAT 6-24 HRS) Nasopharyngeal Nasopharyngeal Swab     Status: None   Collection Time: 06/23/19 10:31 PM   Specimen: Nasopharyngeal Swab  Result Value Ref Range Status   SARS Coronavirus 2 NEGATIVE NEGATIVE Final    Comment: (NOTE) SARS-CoV-2 target nucleic acids are NOT DETECTED. The SARS-CoV-2 RNA is generally detectable in upper and lower respiratory specimens during the acute phase of infection. Negative results do not preclude SARS-CoV-2 infection, do not rule out co-infections with other pathogens, and should not be used as the sole basis for treatment or other patient management decisions. Negative results must be combined with clinical observations, patient history, and epidemiological information. The expected result is Negative. Fact Sheet for Patients: SugarRoll.be Fact Sheet for Healthcare Providers: https://www.woods-mathews.com/ This test is not yet approved or cleared by the [place] FDA and  has been authorized for detection and/or diagnosis of SARS-CoV-2 by FDA under an Emergency Use Authorization (EUA). This EUA will remain  in effect (meaning this test can be used) for the duration of the COVID-19 declaration under Section 56 4(b)(1) of the Act, 21 U.S.C. section 360bbb-3(b)(1), unless the authorization is terminated or revoked sooner. Performed at [facility], [place] [address], [place], [place] [address]      Labs: Basic Metabolic Panel: Recent Labs  Lab 06/23/19 1752 06/23/19 1810 06/24/19 0220 06/24/19 0522 06/25/19 0008  NA 141 140  --  141 139  K 3.6 3.6  --  3.7 4.1  CL 109 112*  --  108 108  CO2 18*  --   --  22 24  GLUCOSE 129* 127*  --  125* 123*  BUN 24* 30*  --  23 23  CREATININE  1.56* 1.40*  --  1.59* 1.47*  CALCIUM 9.1  --   --  8.7* 8.4*  MG  --   --  1.7  --  2.3   Liver Function Tests: Recent Labs  Lab 06/23/19 1752 06/25/19 0008  AST 18 23  ALT 11 11  ALKPHOS 70 52  BILITOT 0.4 1.1  PROT 6.6 5.8*  ALBUMIN 3.2* 2.5*   No results for input(s): LIPASE, AMYLASE in the last 168 hours. No results for input(s): AMMONIA in the last 168 hours. CBC: Recent Labs  Lab  06/23/19 1752 06/23/19 1810 06/24/19 0522 06/25/19 0008  WBC 6.3  --  11.9* 10.9*  NEUTROABS 2.2  --   --   --   HGB 15.6 16.3 14.3 12.7*  HCT 48.4 48.0 42.4 38.8*  MCV 105.0*  --  98.8 101.0*  PLT 139*  --  133* 122*   Cardiac Enzymes: No results for input(s): CKTOTAL, CKMB, CKMBINDEX, TROPONINI in the last 168 hours. BNP: BNP (last 3 results) No results for input(s): BNP in the last 8760 hours.  ProBNP (last 3 results) No results for input(s): PROBNP in the last 8760 hours.  CBG: No results for input(s): GLUCAP in the last 168 hours.     Signed:  Fayrene Helper MD.  [facility] 06/25/2019, 4:30 PM

## 2019-06-27 DIAGNOSIS — I4891 Unspecified atrial fibrillation: Secondary | ICD-10-CM | POA: Diagnosis not present

## 2019-06-27 DIAGNOSIS — M6281 Muscle weakness (generalized): Secondary | ICD-10-CM | POA: Diagnosis not present

## 2019-06-27 DIAGNOSIS — Z8546 Personal history of malignant neoplasm of prostate: Secondary | ICD-10-CM | POA: Diagnosis not present

## 2019-06-27 DIAGNOSIS — I10 Essential (primary) hypertension: Secondary | ICD-10-CM | POA: Diagnosis not present

## 2019-06-27 DIAGNOSIS — G8929 Other chronic pain: Secondary | ICD-10-CM | POA: Diagnosis not present

## 2019-06-27 DIAGNOSIS — Z9181 History of falling: Secondary | ICD-10-CM | POA: Diagnosis not present

## 2019-06-27 DIAGNOSIS — Z96652 Presence of left artificial knee joint: Secondary | ICD-10-CM | POA: Diagnosis not present

## 2019-06-27 DIAGNOSIS — Z7901 Long term (current) use of anticoagulants: Secondary | ICD-10-CM | POA: Diagnosis not present

## 2019-06-28 DIAGNOSIS — Z8546 Personal history of malignant neoplasm of prostate: Secondary | ICD-10-CM | POA: Diagnosis not present

## 2019-06-28 DIAGNOSIS — M6281 Muscle weakness (generalized): Secondary | ICD-10-CM | POA: Diagnosis not present

## 2019-06-28 DIAGNOSIS — I10 Essential (primary) hypertension: Secondary | ICD-10-CM | POA: Diagnosis not present

## 2019-06-28 DIAGNOSIS — I4891 Unspecified atrial fibrillation: Secondary | ICD-10-CM | POA: Diagnosis not present

## 2019-06-28 DIAGNOSIS — Z7901 Long term (current) use of anticoagulants: Secondary | ICD-10-CM | POA: Diagnosis not present

## 2019-06-28 DIAGNOSIS — Z9181 History of falling: Secondary | ICD-10-CM | POA: Diagnosis not present

## 2019-06-28 DIAGNOSIS — Z96652 Presence of left artificial knee joint: Secondary | ICD-10-CM | POA: Diagnosis not present

## 2019-06-28 DIAGNOSIS — G8929 Other chronic pain: Secondary | ICD-10-CM | POA: Diagnosis not present

## 2019-06-30 DIAGNOSIS — Z6821 Body mass index (BMI) 21.0-21.9, adult: Secondary | ICD-10-CM | POA: Diagnosis not present

## 2019-06-30 DIAGNOSIS — R55 Syncope and collapse: Secondary | ICD-10-CM | POA: Diagnosis not present

## 2019-06-30 DIAGNOSIS — D696 Thrombocytopenia, unspecified: Secondary | ICD-10-CM | POA: Diagnosis not present

## 2019-06-30 DIAGNOSIS — I480 Paroxysmal atrial fibrillation: Secondary | ICD-10-CM | POA: Diagnosis not present

## 2019-06-30 DIAGNOSIS — R7989 Other specified abnormal findings of blood chemistry: Secondary | ICD-10-CM | POA: Diagnosis not present

## 2019-07-01 DIAGNOSIS — G8929 Other chronic pain: Secondary | ICD-10-CM | POA: Diagnosis not present

## 2019-07-01 DIAGNOSIS — Z96652 Presence of left artificial knee joint: Secondary | ICD-10-CM | POA: Diagnosis not present

## 2019-07-01 DIAGNOSIS — Z7901 Long term (current) use of anticoagulants: Secondary | ICD-10-CM | POA: Diagnosis not present

## 2019-07-01 DIAGNOSIS — Z9181 History of falling: Secondary | ICD-10-CM | POA: Diagnosis not present

## 2019-07-01 DIAGNOSIS — Z8546 Personal history of malignant neoplasm of prostate: Secondary | ICD-10-CM | POA: Diagnosis not present

## 2019-07-01 DIAGNOSIS — M6281 Muscle weakness (generalized): Secondary | ICD-10-CM | POA: Diagnosis not present

## 2019-07-01 DIAGNOSIS — I10 Essential (primary) hypertension: Secondary | ICD-10-CM | POA: Diagnosis not present

## 2019-07-01 DIAGNOSIS — I4891 Unspecified atrial fibrillation: Secondary | ICD-10-CM | POA: Diagnosis not present

## 2019-07-05 ENCOUNTER — Ambulatory Visit: Payer: Medicare HMO | Admitting: Cardiology

## 2019-07-05 ENCOUNTER — Other Ambulatory Visit: Payer: Self-pay

## 2019-07-05 ENCOUNTER — Encounter: Payer: Self-pay | Admitting: Cardiology

## 2019-07-05 DIAGNOSIS — I480 Paroxysmal atrial fibrillation: Secondary | ICD-10-CM | POA: Diagnosis not present

## 2019-07-05 MED ORDER — PRAVASTATIN SODIUM 20 MG PO TABS: 20.0000 mg | ORAL_TABLET | Freq: Every evening | ORAL | 2 refills | Status: AC

## 2019-07-05 MED ORDER — APIXABAN 2.5 MG PO TABS: 2.5000 mg | ORAL_TABLET | Freq: Two times a day (BID) | ORAL | 2 refills | Status: AC

## 2019-07-05 NOTE — Progress Notes (Signed)
Cardiology Office Note:    Date:  07/05/2019   ID:  Alan Rios, DOB 1919/06/22, MRN [id number]  PCP:  Alan Bender, PA-C  Cardiologist:  Alan Lindau, MD   Referring MD: Alan Bender, PA-C    ASSESSMENT:    1. Paroxysmal atrial fibrillation (HCC)    PLAN:    In order of problems listed above:  1. Paroxysmal atrial fibrillation:I discussed with the patient atrial fibrillation, disease process. Management and therapy including rate and rhythm control, anticoagulation benefits and potential risks were discussed extensively with the patient. Patient had multiple questions which were answered to patient's satisfaction.  Given his advanced age and renal insufficiency in the recent past I would feel uncomfortable in putting him on a full dose of anticoagulation and I agree with the hospitalist support Eliquis 2.5 mg twice daily.  Benefits and potential risks were explained the patient and the son also agreed that they want [place] to stay on this dose and would not risk a higher dose.  He also uses a walker though he has not fallen down.Patient will be seen in follow-up appointment in 6 months or earlier if the patient has any concerns.  I reviewed his records extensively and questions were answered to his satisfaction.  Total time for this evaluation was 35 minutes.  This included record review.    Medication Adjustments/Labs and Tests Ordered: Current medicines are reviewed at length with the patient today.  Concerns regarding medicines are outlined above.  No orders of the defined types were placed in this encounter.  No orders of the defined types were placed in this encounter.    No chief complaint on file.    History of Present Illness:    Alan Rios is a 85 y.o. male.  Patient is in exceptionally for his age.  He went to [facility] as he was found to be not doing well.  He was found to be in atrial fibrillation with rapid ventricular rate.  Amiodarone was  initiated and subsequently did well and converted into sinus rhythm he was initiated on anticoagulation.  Subsequently is done fine.  No chest pain orthopnea or PND.  At the time of my evaluation, the patient is alert awake oriented and in no distress.  Past Medical History:  Diagnosis Date  . Arthritis   . Cancer of prostate ([place])   . Joint pain   . Joint swelling     Past Surgical History:  Procedure Laterality Date  . cataract surgery Bilateral   . TOTAL KNEE ARTHROPLASTY Left 11/14/2014   Procedure: TOTAL KNEE ARTHROPLASTY;  Surgeon: Kerin Salen, MD;  Location: [facility];  Service: Orthopedics;  Laterality: Left;    Current Medications: Current Meds  Medication Sig  . apixaban (ELIQUIS) 2.5 MG TABS tablet Take 1 tablet (2.5 mg total) by mouth 2 (two) times daily.  . bethanechol (URECHOLINE) 25 MG tablet Take 25 mg by mouth 2 (two) times daily.  . Cyanocobalamin (VITAMIN B12 PO) Take 1 tablet by mouth daily.   . metoprolol tartrate (LOPRESSOR) 25 MG tablet Take 0.5 tablets (12.5 mg total) by mouth 2 (two) times daily.  . pravastatin (PRAVACHOL) 20 MG tablet Take 1 tablet (20 mg total) by mouth every evening.  . tamsulosin (FLOMAX) 0.4 MG CAPS capsule Take 0.4 mg by mouth 2 (two) times daily with a meal.      Allergies:   Penicillins   Social History   Socioeconomic History  . Marital status:  Married    Spouse name: Not on file  . Number of children: Not on file  . Years of education: Not on file  . Highest education level: Not on file  Occupational History  . Not on file  Social Needs  . Financial resource strain: Not on file  . Food insecurity    Worry: Not on file    Inability: Not on file  . Transportation needs    Medical: Not on file    Non-medical: Not on file  Tobacco Use  . Smoking status: Never Smoker  . Smokeless tobacco: Never Used  Substance and Sexual Activity  . Alcohol use: No  . Drug use: No  . Sexual activity: Never  Lifestyle  . Physical  activity    Days per week: Not on file    Minutes per session: Not on file  . Stress: Not on file  Relationships  . Social Herbalist on phone: Not on file    Gets together: Not on file    Attends religious service: Not on file    Active member of club or organization: Not on file    Attends meetings of clubs or organizations: Not on file    Relationship status: Not on file  Other Topics Concern  . Not on file  Social History Narrative  . Not on file     Family History: The patient's family history is not on file.  ROS:   Please see the history of present illness.    All other systems reviewed and are negative.  EKGs/Labs/Other Studies Reviewed:    The following studies were reviewed today: I reviewed records from hospital at extensive length.   Recent Labs: 06/23/2019: TSH 2.980 06/25/2019: ALT 11; BUN 23; Creatinine, Ser 1.47; Hemoglobin 12.7; Magnesium 2.3; Platelets 122; Potassium 4.1; Sodium 139  Recent Lipid Panel    Component Value Date/Time   CHOL 115 06/25/2019 0008   TRIG 40 06/25/2019 0008   HDL 43 06/25/2019 0008   CHOLHDL 2.7 06/25/2019 0008   VLDL 8 06/25/2019 0008   LDLCALC 64 06/25/2019 0008    Physical Exam:    VS:  BP (!) 138/58 (BP Location: Left Arm, Patient Position: Sitting, Cuff Size: Normal)   Pulse (!) 58   Ht 5\' 9"  (1.753 m)   Wt 144 lb 3.2 oz (65.4 kg)   SpO2 100%   BMI 21.29 kg/m     Wt Readings from Last 3 Encounters:  07/05/19 144 lb 3.2 oz (65.4 kg)  06/25/19 144 lb 6.4 oz (65.5 kg)  12/05/14 134 lb 12.8 oz (61.1 kg)     GEN: Patient is in no acute distress HEENT: Normal NECK: No JVD; No carotid bruits LYMPHATICS: No lymphadenopathy CARDIAC: Hear sounds regular, 2/6 systolic murmur at the apex. RESPIRATORY:  Clear to auscultation without rales, wheezing or rhonchi  ABDOMEN: Soft, non-tender, non-distended MUSCULOSKELETAL:  No edema; No deformity  SKIN: Warm and dry NEUROLOGIC:  Alert and oriented x 3  PSYCHIATRIC:  Normal affect   Signed, Alan Lindau, MD  07/05/2019 11:22 AM    [place]

## 2019-07-05 NOTE — Patient Instructions (Signed)
Your physician recommends that you continue on your current medications as directed. Please refer to the Current Medication list given to you today.  If you need a refill on your cardiac medications before your next appointment, please call your pharmacy.   Lab work: NONE If you have labs (blood work) drawn today and your tests are completely normal, you will receive your results only by: Marland Kitchen MyChart Message (if you have MyChart) OR . A paper copy in the mail If you have any lab test that is abnormal or we need to change your treatment, we will call you to review the results.  Testing/Procedures: NONE  Follow-Up: At [facility], you and your health needs are our priority.  As part of our continuing mission to provide you with exceptional heart care, we have created designated Provider Care Teams.  These Care Teams include your primary Cardiologist (physician) and Advanced Practice Providers (APPs -  Physician Assistants and Nurse Practitioners) who all work together to provide you with the care you need, when you need it. You will need a follow up appointment in 6 months.

## 2019-07-05 NOTE — Addendum Note (Signed)
Addended by: Beckey Rutter on: 07/05/2019 01:49 PM   Modules accepted: Orders

## 2019-07-09 DIAGNOSIS — G8929 Other chronic pain: Secondary | ICD-10-CM | POA: Diagnosis not present

## 2019-07-09 DIAGNOSIS — I10 Essential (primary) hypertension: Secondary | ICD-10-CM | POA: Diagnosis not present

## 2019-07-09 DIAGNOSIS — Z9181 History of falling: Secondary | ICD-10-CM | POA: Diagnosis not present

## 2019-07-09 DIAGNOSIS — Z96652 Presence of left artificial knee joint: Secondary | ICD-10-CM | POA: Diagnosis not present

## 2019-07-09 DIAGNOSIS — Z8546 Personal history of malignant neoplasm of prostate: Secondary | ICD-10-CM | POA: Diagnosis not present

## 2019-07-09 DIAGNOSIS — M6281 Muscle weakness (generalized): Secondary | ICD-10-CM | POA: Diagnosis not present

## 2019-07-09 DIAGNOSIS — I4891 Unspecified atrial fibrillation: Secondary | ICD-10-CM | POA: Diagnosis not present

## 2019-07-09 DIAGNOSIS — Z7901 Long term (current) use of anticoagulants: Secondary | ICD-10-CM | POA: Diagnosis not present

## 2019-07-10 DIAGNOSIS — Z8546 Personal history of malignant neoplasm of prostate: Secondary | ICD-10-CM | POA: Diagnosis not present

## 2019-07-10 DIAGNOSIS — I10 Essential (primary) hypertension: Secondary | ICD-10-CM | POA: Diagnosis not present

## 2019-07-10 DIAGNOSIS — Z9181 History of falling: Secondary | ICD-10-CM | POA: Diagnosis not present

## 2019-07-10 DIAGNOSIS — M6281 Muscle weakness (generalized): Secondary | ICD-10-CM | POA: Diagnosis not present

## 2019-07-10 DIAGNOSIS — G8929 Other chronic pain: Secondary | ICD-10-CM | POA: Diagnosis not present

## 2019-07-10 DIAGNOSIS — Z7901 Long term (current) use of anticoagulants: Secondary | ICD-10-CM | POA: Diagnosis not present

## 2019-07-10 DIAGNOSIS — Z96652 Presence of left artificial knee joint: Secondary | ICD-10-CM | POA: Diagnosis not present

## 2019-07-10 DIAGNOSIS — I4891 Unspecified atrial fibrillation: Secondary | ICD-10-CM | POA: Diagnosis not present

## 2019-07-12 DIAGNOSIS — Z7901 Long term (current) use of anticoagulants: Secondary | ICD-10-CM | POA: Diagnosis not present

## 2019-07-12 DIAGNOSIS — Z9181 History of falling: Secondary | ICD-10-CM | POA: Diagnosis not present

## 2019-07-12 DIAGNOSIS — I4891 Unspecified atrial fibrillation: Secondary | ICD-10-CM | POA: Diagnosis not present

## 2019-07-12 DIAGNOSIS — M6281 Muscle weakness (generalized): Secondary | ICD-10-CM | POA: Diagnosis not present

## 2019-07-12 DIAGNOSIS — G8929 Other chronic pain: Secondary | ICD-10-CM | POA: Diagnosis not present

## 2019-07-12 DIAGNOSIS — Z96652 Presence of left artificial knee joint: Secondary | ICD-10-CM | POA: Diagnosis not present

## 2019-07-12 DIAGNOSIS — I10 Essential (primary) hypertension: Secondary | ICD-10-CM | POA: Diagnosis not present

## 2019-07-12 DIAGNOSIS — Z8546 Personal history of malignant neoplasm of prostate: Secondary | ICD-10-CM | POA: Diagnosis not present

## 2019-07-13 DIAGNOSIS — I10 Essential (primary) hypertension: Secondary | ICD-10-CM | POA: Diagnosis not present

## 2019-07-13 DIAGNOSIS — Z7901 Long term (current) use of anticoagulants: Secondary | ICD-10-CM | POA: Diagnosis not present

## 2019-07-13 DIAGNOSIS — Z96652 Presence of left artificial knee joint: Secondary | ICD-10-CM | POA: Diagnosis not present

## 2019-07-13 DIAGNOSIS — I4891 Unspecified atrial fibrillation: Secondary | ICD-10-CM | POA: Diagnosis not present

## 2019-07-13 DIAGNOSIS — Z8546 Personal history of malignant neoplasm of prostate: Secondary | ICD-10-CM | POA: Diagnosis not present

## 2019-07-13 DIAGNOSIS — G8929 Other chronic pain: Secondary | ICD-10-CM | POA: Diagnosis not present

## 2019-07-13 DIAGNOSIS — Z9181 History of falling: Secondary | ICD-10-CM | POA: Diagnosis not present

## 2019-07-13 DIAGNOSIS — M6281 Muscle weakness (generalized): Secondary | ICD-10-CM | POA: Diagnosis not present

## 2019-07-14 DIAGNOSIS — Z7901 Long term (current) use of anticoagulants: Secondary | ICD-10-CM | POA: Diagnosis not present

## 2019-07-14 DIAGNOSIS — Z96652 Presence of left artificial knee joint: Secondary | ICD-10-CM | POA: Diagnosis not present

## 2019-07-14 DIAGNOSIS — Z8546 Personal history of malignant neoplasm of prostate: Secondary | ICD-10-CM | POA: Diagnosis not present

## 2019-07-14 DIAGNOSIS — G8929 Other chronic pain: Secondary | ICD-10-CM | POA: Diagnosis not present

## 2019-07-14 DIAGNOSIS — M6281 Muscle weakness (generalized): Secondary | ICD-10-CM | POA: Diagnosis not present

## 2019-07-14 DIAGNOSIS — Z9181 History of falling: Secondary | ICD-10-CM | POA: Diagnosis not present

## 2019-07-14 DIAGNOSIS — I4891 Unspecified atrial fibrillation: Secondary | ICD-10-CM | POA: Diagnosis not present

## 2019-07-14 DIAGNOSIS — I10 Essential (primary) hypertension: Secondary | ICD-10-CM | POA: Diagnosis not present

## 2019-07-15 DIAGNOSIS — I4891 Unspecified atrial fibrillation: Secondary | ICD-10-CM | POA: Diagnosis not present

## 2019-07-15 DIAGNOSIS — Z7901 Long term (current) use of anticoagulants: Secondary | ICD-10-CM | POA: Diagnosis not present

## 2019-07-15 DIAGNOSIS — I10 Essential (primary) hypertension: Secondary | ICD-10-CM | POA: Diagnosis not present

## 2019-07-15 DIAGNOSIS — M6281 Muscle weakness (generalized): Secondary | ICD-10-CM | POA: Diagnosis not present

## 2019-07-15 DIAGNOSIS — Z8546 Personal history of malignant neoplasm of prostate: Secondary | ICD-10-CM | POA: Diagnosis not present

## 2019-07-15 DIAGNOSIS — Z9181 History of falling: Secondary | ICD-10-CM | POA: Diagnosis not present

## 2019-07-15 DIAGNOSIS — Z96652 Presence of left artificial knee joint: Secondary | ICD-10-CM | POA: Diagnosis not present

## 2019-07-15 DIAGNOSIS — G8929 Other chronic pain: Secondary | ICD-10-CM | POA: Diagnosis not present

## 2019-07-16 DIAGNOSIS — Z8546 Personal history of malignant neoplasm of prostate: Secondary | ICD-10-CM | POA: Diagnosis not present

## 2019-07-16 DIAGNOSIS — M6281 Muscle weakness (generalized): Secondary | ICD-10-CM | POA: Diagnosis not present

## 2019-07-16 DIAGNOSIS — Z9181 History of falling: Secondary | ICD-10-CM | POA: Diagnosis not present

## 2019-07-16 DIAGNOSIS — I10 Essential (primary) hypertension: Secondary | ICD-10-CM | POA: Diagnosis not present

## 2019-07-16 DIAGNOSIS — G8929 Other chronic pain: Secondary | ICD-10-CM | POA: Diagnosis not present

## 2019-07-16 DIAGNOSIS — I4891 Unspecified atrial fibrillation: Secondary | ICD-10-CM | POA: Diagnosis not present

## 2019-07-16 DIAGNOSIS — Z7901 Long term (current) use of anticoagulants: Secondary | ICD-10-CM | POA: Diagnosis not present

## 2019-07-16 DIAGNOSIS — Z96652 Presence of left artificial knee joint: Secondary | ICD-10-CM | POA: Diagnosis not present

## 2019-07-19 DIAGNOSIS — Z9181 History of falling: Secondary | ICD-10-CM | POA: Diagnosis not present

## 2019-07-19 DIAGNOSIS — Z96652 Presence of left artificial knee joint: Secondary | ICD-10-CM | POA: Diagnosis not present

## 2019-07-19 DIAGNOSIS — Z7901 Long term (current) use of anticoagulants: Secondary | ICD-10-CM | POA: Diagnosis not present

## 2019-07-19 DIAGNOSIS — I4891 Unspecified atrial fibrillation: Secondary | ICD-10-CM | POA: Diagnosis not present

## 2019-07-19 DIAGNOSIS — G8929 Other chronic pain: Secondary | ICD-10-CM | POA: Diagnosis not present

## 2019-07-19 DIAGNOSIS — M6281 Muscle weakness (generalized): Secondary | ICD-10-CM | POA: Diagnosis not present

## 2019-07-19 DIAGNOSIS — Z8546 Personal history of malignant neoplasm of prostate: Secondary | ICD-10-CM | POA: Diagnosis not present

## 2019-07-19 DIAGNOSIS — I10 Essential (primary) hypertension: Secondary | ICD-10-CM | POA: Diagnosis not present

## 2019-07-20 DIAGNOSIS — Z7901 Long term (current) use of anticoagulants: Secondary | ICD-10-CM | POA: Diagnosis not present

## 2019-07-20 DIAGNOSIS — M6281 Muscle weakness (generalized): Secondary | ICD-10-CM | POA: Diagnosis not present

## 2019-07-20 DIAGNOSIS — I4891 Unspecified atrial fibrillation: Secondary | ICD-10-CM | POA: Diagnosis not present

## 2019-07-20 DIAGNOSIS — G8929 Other chronic pain: Secondary | ICD-10-CM | POA: Diagnosis not present

## 2019-07-20 DIAGNOSIS — I10 Essential (primary) hypertension: Secondary | ICD-10-CM | POA: Diagnosis not present

## 2019-07-20 DIAGNOSIS — Z9181 History of falling: Secondary | ICD-10-CM | POA: Diagnosis not present

## 2019-07-20 DIAGNOSIS — Z96652 Presence of left artificial knee joint: Secondary | ICD-10-CM | POA: Diagnosis not present

## 2019-07-20 DIAGNOSIS — Z8546 Personal history of malignant neoplasm of prostate: Secondary | ICD-10-CM | POA: Diagnosis not present

## 2019-07-21 DIAGNOSIS — Z8546 Personal history of malignant neoplasm of prostate: Secondary | ICD-10-CM | POA: Diagnosis not present

## 2019-07-21 DIAGNOSIS — I4891 Unspecified atrial fibrillation: Secondary | ICD-10-CM | POA: Diagnosis not present

## 2019-07-21 DIAGNOSIS — Z9181 History of falling: Secondary | ICD-10-CM | POA: Diagnosis not present

## 2019-07-21 DIAGNOSIS — M6281 Muscle weakness (generalized): Secondary | ICD-10-CM | POA: Diagnosis not present

## 2019-07-21 DIAGNOSIS — G8929 Other chronic pain: Secondary | ICD-10-CM | POA: Diagnosis not present

## 2019-07-21 DIAGNOSIS — Z96652 Presence of left artificial knee joint: Secondary | ICD-10-CM | POA: Diagnosis not present

## 2019-07-21 DIAGNOSIS — I10 Essential (primary) hypertension: Secondary | ICD-10-CM | POA: Diagnosis not present

## 2019-07-21 DIAGNOSIS — Z7901 Long term (current) use of anticoagulants: Secondary | ICD-10-CM | POA: Diagnosis not present

## 2019-07-22 DIAGNOSIS — C61 Malignant neoplasm of prostate: Secondary | ICD-10-CM | POA: Diagnosis not present

## 2019-07-22 DIAGNOSIS — R339 Retention of urine, unspecified: Secondary | ICD-10-CM | POA: Diagnosis not present

## 2019-07-23 DIAGNOSIS — Z9181 History of falling: Secondary | ICD-10-CM | POA: Diagnosis not present

## 2019-07-23 DIAGNOSIS — M6281 Muscle weakness (generalized): Secondary | ICD-10-CM | POA: Diagnosis not present

## 2019-07-23 DIAGNOSIS — Z7901 Long term (current) use of anticoagulants: Secondary | ICD-10-CM | POA: Diagnosis not present

## 2019-07-23 DIAGNOSIS — Z8546 Personal history of malignant neoplasm of prostate: Secondary | ICD-10-CM | POA: Diagnosis not present

## 2019-07-23 DIAGNOSIS — I4891 Unspecified atrial fibrillation: Secondary | ICD-10-CM | POA: Diagnosis not present

## 2019-07-23 DIAGNOSIS — Z96652 Presence of left artificial knee joint: Secondary | ICD-10-CM | POA: Diagnosis not present

## 2019-07-23 DIAGNOSIS — G8929 Other chronic pain: Secondary | ICD-10-CM | POA: Diagnosis not present

## 2019-07-23 DIAGNOSIS — I10 Essential (primary) hypertension: Secondary | ICD-10-CM | POA: Diagnosis not present

## 2019-08-11 ENCOUNTER — Ambulatory Visit: Payer: Medicare HMO | Admitting: Neurology

## 2019-09-12 DIAGNOSIS — Z8546 Personal history of malignant neoplasm of prostate: Secondary | ICD-10-CM | POA: Diagnosis not present

## 2019-09-12 DIAGNOSIS — I10 Essential (primary) hypertension: Secondary | ICD-10-CM | POA: Diagnosis not present

## 2019-09-12 DIAGNOSIS — N183 Chronic kidney disease, stage 3 unspecified: Secondary | ICD-10-CM | POA: Diagnosis not present

## 2019-09-12 DIAGNOSIS — I214 Non-ST elevation (NSTEMI) myocardial infarction: Secondary | ICD-10-CM | POA: Diagnosis not present

## 2019-09-12 DIAGNOSIS — N400 Enlarged prostate without lower urinary tract symptoms: Secondary | ICD-10-CM | POA: Diagnosis not present

## 2019-09-12 DIAGNOSIS — N189 Chronic kidney disease, unspecified: Secondary | ICD-10-CM | POA: Diagnosis not present

## 2019-09-12 DIAGNOSIS — R000 Tachycardia, unspecified: Secondary | ICD-10-CM | POA: Diagnosis not present

## 2019-09-12 DIAGNOSIS — E02 Subclinical iodine-deficiency hypothyroidism: Secondary | ICD-10-CM | POA: Diagnosis not present

## 2019-09-12 DIAGNOSIS — R079 Chest pain, unspecified: Secondary | ICD-10-CM | POA: Diagnosis not present

## 2019-09-12 DIAGNOSIS — I483 Typical atrial flutter: Secondary | ICD-10-CM | POA: Diagnosis not present

## 2019-09-12 DIAGNOSIS — R9431 Abnormal electrocardiogram [ECG] [EKG]: Secondary | ICD-10-CM | POA: Diagnosis not present

## 2019-09-12 DIAGNOSIS — I509 Heart failure, unspecified: Secondary | ICD-10-CM | POA: Diagnosis not present

## 2019-09-12 DIAGNOSIS — E039 Hypothyroidism, unspecified: Secondary | ICD-10-CM | POA: Diagnosis not present

## 2019-09-12 DIAGNOSIS — J189 Pneumonia, unspecified organism: Secondary | ICD-10-CM | POA: Diagnosis not present

## 2019-09-12 DIAGNOSIS — R05 Cough: Secondary | ICD-10-CM | POA: Diagnosis not present

## 2019-09-12 DIAGNOSIS — Z79899 Other long term (current) drug therapy: Secondary | ICD-10-CM | POA: Diagnosis not present

## 2019-09-12 DIAGNOSIS — I4892 Unspecified atrial flutter: Secondary | ICD-10-CM | POA: Diagnosis not present

## 2019-09-12 DIAGNOSIS — I4891 Unspecified atrial fibrillation: Secondary | ICD-10-CM | POA: Diagnosis not present

## 2019-09-12 DIAGNOSIS — I130 Hypertensive heart and chronic kidney disease with heart failure and stage 1 through stage 4 chronic kidney disease, or unspecified chronic kidney disease: Secondary | ICD-10-CM | POA: Diagnosis not present

## 2019-09-12 DIAGNOSIS — I4519 Other right bundle-branch block: Secondary | ICD-10-CM | POA: Diagnosis not present

## 2019-09-12 DIAGNOSIS — R339 Retention of urine, unspecified: Secondary | ICD-10-CM | POA: Diagnosis not present

## 2019-09-12 DIAGNOSIS — R5381 Other malaise: Secondary | ICD-10-CM | POA: Diagnosis not present

## 2019-09-12 DIAGNOSIS — R0902 Hypoxemia: Secondary | ICD-10-CM | POA: Diagnosis not present

## 2019-09-12 DIAGNOSIS — R7989 Other specified abnormal findings of blood chemistry: Secondary | ICD-10-CM | POA: Diagnosis not present

## 2019-09-12 DIAGNOSIS — I5032 Chronic diastolic (congestive) heart failure: Secondary | ICD-10-CM | POA: Diagnosis not present

## 2019-09-12 DIAGNOSIS — R112 Nausea with vomiting, unspecified: Secondary | ICD-10-CM | POA: Diagnosis not present

## 2019-09-12 DIAGNOSIS — R110 Nausea: Secondary | ICD-10-CM | POA: Diagnosis not present

## 2019-09-12 DIAGNOSIS — I272 Pulmonary hypertension, unspecified: Secondary | ICD-10-CM | POA: Diagnosis not present

## 2019-09-12 DIAGNOSIS — I248 Other forms of acute ischemic heart disease: Secondary | ICD-10-CM | POA: Diagnosis not present

## 2019-09-12 DIAGNOSIS — Z20828 Contact with and (suspected) exposure to other viral communicable diseases: Secondary | ICD-10-CM | POA: Diagnosis not present

## 2019-09-20 DIAGNOSIS — J90 Pleural effusion, not elsewhere classified: Secondary | ICD-10-CM | POA: Diagnosis not present

## 2019-09-20 DIAGNOSIS — Z79899 Other long term (current) drug therapy: Secondary | ICD-10-CM | POA: Diagnosis not present

## 2019-09-20 DIAGNOSIS — N400 Enlarged prostate without lower urinary tract symptoms: Secondary | ICD-10-CM | POA: Diagnosis not present

## 2019-09-20 DIAGNOSIS — R0602 Shortness of breath: Secondary | ICD-10-CM | POA: Diagnosis not present

## 2019-09-20 DIAGNOSIS — Z20828 Contact with and (suspected) exposure to other viral communicable diseases: Secondary | ICD-10-CM | POA: Diagnosis not present

## 2019-09-20 DIAGNOSIS — R339 Retention of urine, unspecified: Secondary | ICD-10-CM | POA: Diagnosis not present

## 2019-09-20 DIAGNOSIS — R6521 Severe sepsis with septic shock: Secondary | ICD-10-CM | POA: Diagnosis not present

## 2019-09-20 DIAGNOSIS — U071 COVID-19: Secondary | ICD-10-CM | POA: Diagnosis not present

## 2019-09-20 DIAGNOSIS — E02 Subclinical iodine-deficiency hypothyroidism: Secondary | ICD-10-CM | POA: Diagnosis not present

## 2019-09-20 DIAGNOSIS — I4891 Unspecified atrial fibrillation: Secondary | ICD-10-CM | POA: Diagnosis not present

## 2019-09-20 DIAGNOSIS — I959 Hypotension, unspecified: Secondary | ICD-10-CM | POA: Diagnosis not present

## 2019-09-20 DIAGNOSIS — I4510 Unspecified right bundle-branch block: Secondary | ICD-10-CM | POA: Diagnosis not present

## 2019-09-20 DIAGNOSIS — J9601 Acute respiratory failure with hypoxia: Secondary | ICD-10-CM | POA: Diagnosis not present

## 2019-09-20 DIAGNOSIS — Z66 Do not resuscitate: Secondary | ICD-10-CM | POA: Diagnosis not present

## 2019-09-20 DIAGNOSIS — R112 Nausea with vomiting, unspecified: Secondary | ICD-10-CM | POA: Diagnosis not present

## 2019-09-20 DIAGNOSIS — N183 Chronic kidney disease, stage 3 unspecified: Secondary | ICD-10-CM | POA: Diagnosis not present

## 2019-09-20 DIAGNOSIS — A419 Sepsis, unspecified organism: Secondary | ICD-10-CM | POA: Diagnosis not present

## 2019-09-20 DIAGNOSIS — R0689 Other abnormalities of breathing: Secondary | ICD-10-CM | POA: Diagnosis not present

## 2019-09-20 DIAGNOSIS — R0902 Hypoxemia: Secondary | ICD-10-CM | POA: Diagnosis not present

## 2019-09-20 DIAGNOSIS — I10 Essential (primary) hypertension: Secondary | ICD-10-CM | POA: Diagnosis not present

## 2019-09-20 DIAGNOSIS — D688 Other specified coagulation defects: Secondary | ICD-10-CM | POA: Diagnosis not present

## 2019-09-20 DIAGNOSIS — I509 Heart failure, unspecified: Secondary | ICD-10-CM | POA: Diagnosis not present

## 2019-09-20 DIAGNOSIS — Z23 Encounter for immunization: Secondary | ICD-10-CM | POA: Diagnosis not present

## 2019-09-20 DIAGNOSIS — J1289 Other viral pneumonia: Secondary | ICD-10-CM | POA: Diagnosis not present

## 2019-09-20 DIAGNOSIS — Z8546 Personal history of malignant neoplasm of prostate: Secondary | ICD-10-CM | POA: Diagnosis not present

## 2019-09-20 DIAGNOSIS — R6520 Severe sepsis without septic shock: Secondary | ICD-10-CM | POA: Diagnosis not present

## 2019-09-20 DIAGNOSIS — A4189 Other specified sepsis: Secondary | ICD-10-CM | POA: Diagnosis not present

## 2019-09-20 DIAGNOSIS — J189 Pneumonia, unspecified organism: Secondary | ICD-10-CM | POA: Diagnosis not present

## 2019-09-20 DIAGNOSIS — J1282 Pneumonia due to coronavirus disease 2019: Secondary | ICD-10-CM | POA: Diagnosis not present

## 2019-09-20 DIAGNOSIS — R918 Other nonspecific abnormal finding of lung field: Secondary | ICD-10-CM | POA: Diagnosis not present

## 2019-09-20 DIAGNOSIS — E039 Hypothyroidism, unspecified: Secondary | ICD-10-CM | POA: Diagnosis not present

## 2019-09-24 DIAGNOSIS — R339 Retention of urine, unspecified: Secondary | ICD-10-CM | POA: Diagnosis not present

## 2019-09-24 DIAGNOSIS — N183 Chronic kidney disease, stage 3 unspecified: Secondary | ICD-10-CM | POA: Diagnosis not present

## 2019-09-24 DIAGNOSIS — I4891 Unspecified atrial fibrillation: Secondary | ICD-10-CM | POA: Diagnosis not present

## 2019-09-24 DIAGNOSIS — J189 Pneumonia, unspecified organism: Secondary | ICD-10-CM | POA: Diagnosis not present

## 2019-09-24 DIAGNOSIS — I509 Heart failure, unspecified: Secondary | ICD-10-CM | POA: Diagnosis not present

## 2019-10-03 DIAGNOSIS — J189 Pneumonia, unspecified organism: Secondary | ICD-10-CM | POA: Diagnosis not present

## 2019-10-03 DIAGNOSIS — I4510 Unspecified right bundle-branch block: Secondary | ICD-10-CM | POA: Diagnosis not present

## 2019-10-03 DIAGNOSIS — E870 Hyperosmolality and hypernatremia: Secondary | ICD-10-CM | POA: Diagnosis not present

## 2019-10-03 DIAGNOSIS — U071 COVID-19: Secondary | ICD-10-CM | POA: Diagnosis not present

## 2019-10-03 DIAGNOSIS — J1282 Pneumonia due to coronavirus disease 2019: Secondary | ICD-10-CM | POA: Diagnosis not present

## 2019-10-03 DIAGNOSIS — R579 Shock, unspecified: Secondary | ICD-10-CM | POA: Diagnosis not present

## 2019-10-03 DIAGNOSIS — I959 Hypotension, unspecified: Secondary | ICD-10-CM | POA: Diagnosis not present

## 2019-10-03 DIAGNOSIS — R918 Other nonspecific abnormal finding of lung field: Secondary | ICD-10-CM | POA: Diagnosis not present

## 2019-10-03 DIAGNOSIS — J9691 Respiratory failure, unspecified with hypoxia: Secondary | ICD-10-CM | POA: Diagnosis not present

## 2019-10-03 DIAGNOSIS — Z66 Do not resuscitate: Secondary | ICD-10-CM | POA: Diagnosis not present

## 2019-10-03 DIAGNOSIS — N179 Acute kidney failure, unspecified: Secondary | ICD-10-CM | POA: Diagnosis not present

## 2019-10-03 DIAGNOSIS — Z23 Encounter for immunization: Secondary | ICD-10-CM | POA: Diagnosis not present

## 2019-10-03 DIAGNOSIS — I499 Cardiac arrhythmia, unspecified: Secondary | ICD-10-CM | POA: Diagnosis not present

## 2019-10-03 DIAGNOSIS — A4189 Other specified sepsis: Secondary | ICD-10-CM | POA: Diagnosis not present

## 2019-10-03 DIAGNOSIS — D72829 Elevated white blood cell count, unspecified: Secondary | ICD-10-CM | POA: Diagnosis not present

## 2019-10-03 DIAGNOSIS — D688 Other specified coagulation defects: Secondary | ICD-10-CM | POA: Diagnosis not present

## 2019-10-03 DIAGNOSIS — A419 Sepsis, unspecified organism: Secondary | ICD-10-CM | POA: Diagnosis not present

## 2019-10-03 DIAGNOSIS — R0902 Hypoxemia: Secondary | ICD-10-CM | POA: Diagnosis not present

## 2019-10-03 DIAGNOSIS — R6520 Severe sepsis without septic shock: Secondary | ICD-10-CM | POA: Diagnosis not present

## 2019-10-03 DIAGNOSIS — J9601 Acute respiratory failure with hypoxia: Secondary | ICD-10-CM | POA: Diagnosis not present

## 2019-10-03 DIAGNOSIS — J90 Pleural effusion, not elsewhere classified: Secondary | ICD-10-CM | POA: Diagnosis not present

## 2019-10-03 DIAGNOSIS — J1289 Other viral pneumonia: Secondary | ICD-10-CM | POA: Diagnosis not present

## 2019-10-03 DIAGNOSIS — R0602 Shortness of breath: Secondary | ICD-10-CM | POA: Diagnosis not present

## 2019-10-03 DIAGNOSIS — Z20828 Contact with and (suspected) exposure to other viral communicable diseases: Secondary | ICD-10-CM | POA: Diagnosis not present

## 2019-10-03 DIAGNOSIS — I4891 Unspecified atrial fibrillation: Secondary | ICD-10-CM | POA: Diagnosis not present

## 2019-10-03 DIAGNOSIS — R6521 Severe sepsis with septic shock: Secondary | ICD-10-CM | POA: Diagnosis not present

## 2019-10-03 DIAGNOSIS — R0689 Other abnormalities of breathing: Secondary | ICD-10-CM | POA: Diagnosis not present

## 2019-10-03 DIAGNOSIS — D689 Coagulation defect, unspecified: Secondary | ICD-10-CM | POA: Diagnosis not present

## 2019-10-03 DIAGNOSIS — J9600 Acute respiratory failure, unspecified whether with hypoxia or hypercapnia: Secondary | ICD-10-CM | POA: Diagnosis not present

## 2019-10-05 ENCOUNTER — Other Ambulatory Visit: Payer: Self-pay

## 2019-10-05 NOTE — Patient Outreach (Signed)
Alan Rios [facility]) Care Management  10/05/2019  Alan Rios [facility] 1919/03/28 [id number]     Transition of Care Referral  Referral Date: 10/05/2019 Referral [place] Discharge Report Date of Discharge: 10/03/2019 Facility: [place]: [facility]    Outreach attempt # 1 to patient. Spoke with patient's son-Kenneth. He freely reported that patient was supposed to be discharged from facility. However, he ended up contracting COVID-19 from his roommate. He was then transferred to [facility] where he currently remains. Son voices that he just got off the phone with the doctor and was told that patient was not doing good and unsure of prognosis/disposition. Support given to son.    Plan: RN CM will close case at this time.   Enzo Montgomery, RN,BSN,CCM [place] Management Telephonic Care Management Coordinator Direct Phone: [phone] Toll Free: [phone] Fax: [phone]

## 2019-11-01 DEATH — deceased

## 2019-12-03 IMAGING — CT CT HEAD CODE STROKE
4 series · 16 of 47 positions shown, 18 images · non-contrast
Comparison: 11/12/2018

CLINICAL DATA: Code stroke.  Unresponsive.

EXAM:
CT HEAD WITHOUT CONTRAST
TECHNIQUE: Contiguous axial images were obtained from the base of the skull
through the vertex without intravenous contrast.

[Series 2: head wo · axial · 0.45mm/px · z∈[+1121,+1241]mm · 7 of 32 slices shown, 9 images]
[im 4/32  brain]
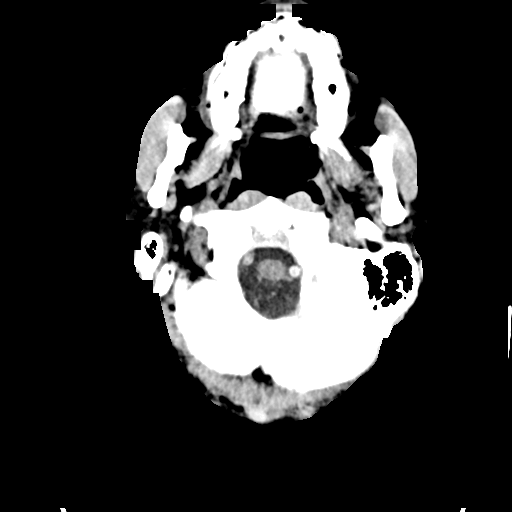
[im 4/32  bone]
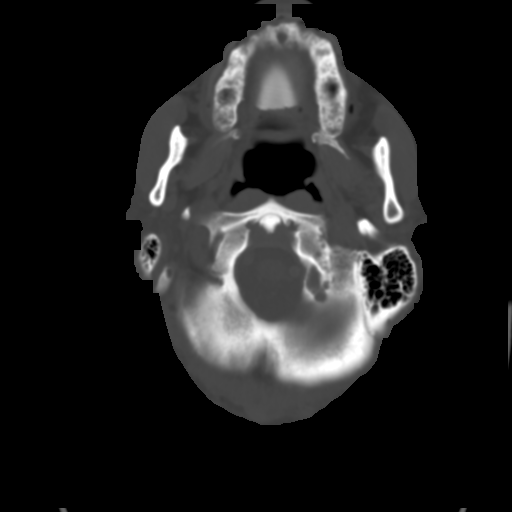
[im 8/32  brain]
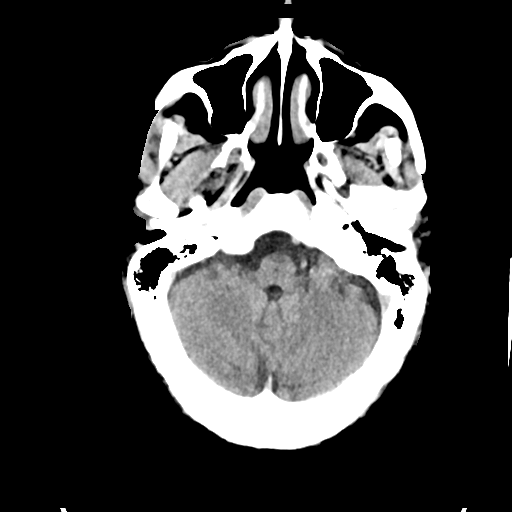
[im 12/32  brain]
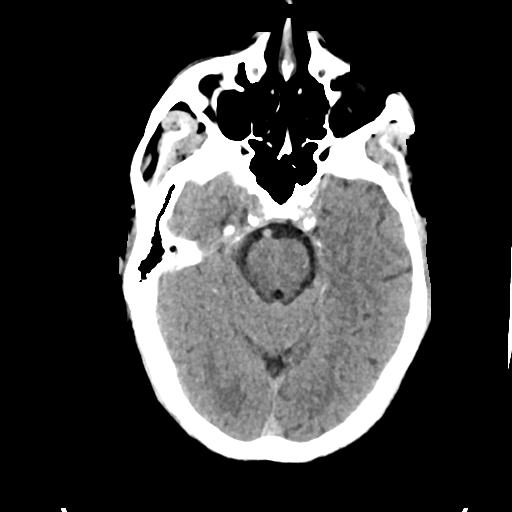
[im 16/32  brain]
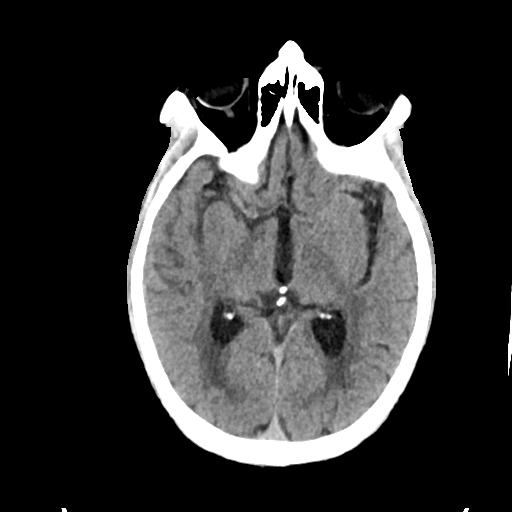
[im 20/32  brain]
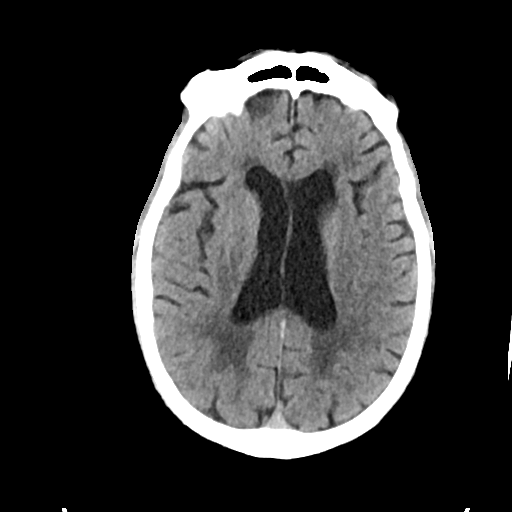
[im 20/32  bone]
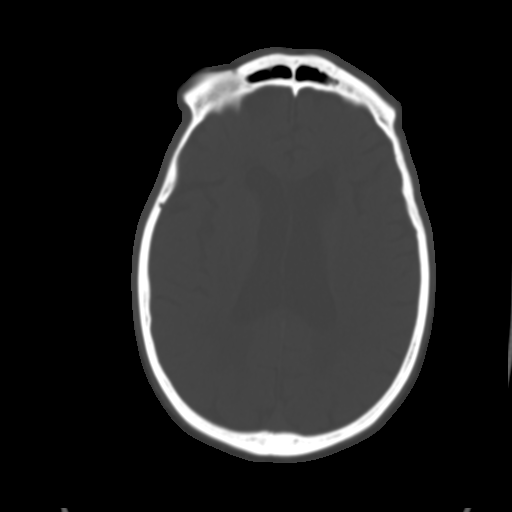
[im 24/32  brain]
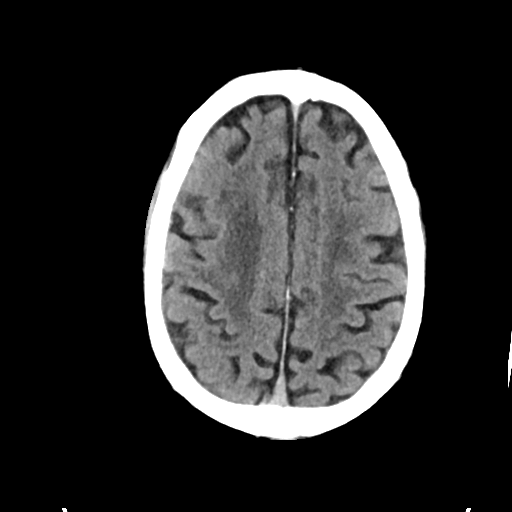
[im 28/32  brain]
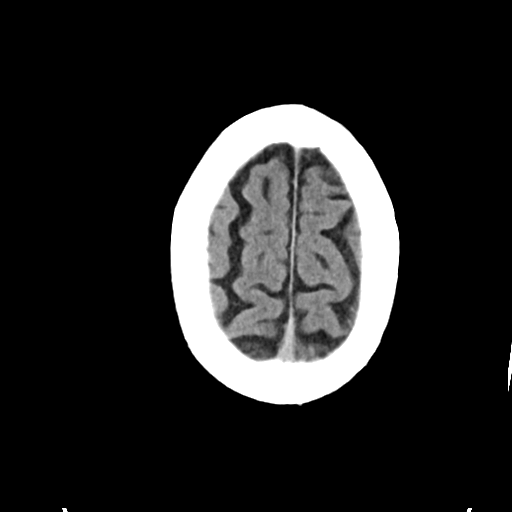

[Series 3: head bone · axial · 0.45mm/px · z∈[+1120,+1152]mm · 3 of 80 slices shown]
[im 8/80  bone]
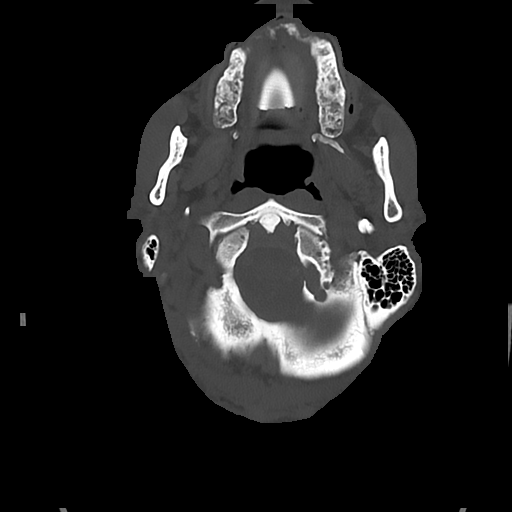
[im 16/80  bone]
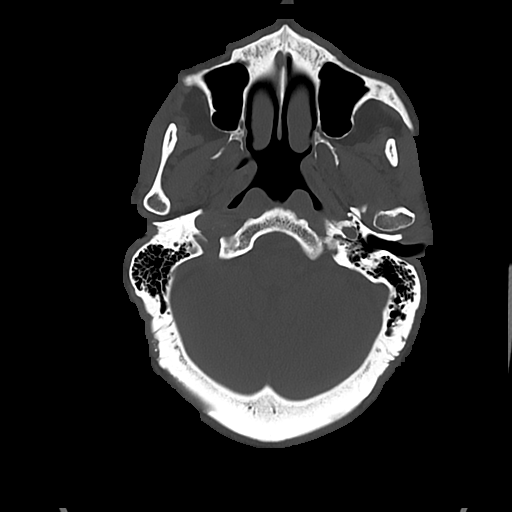
[im 24/80  bone]
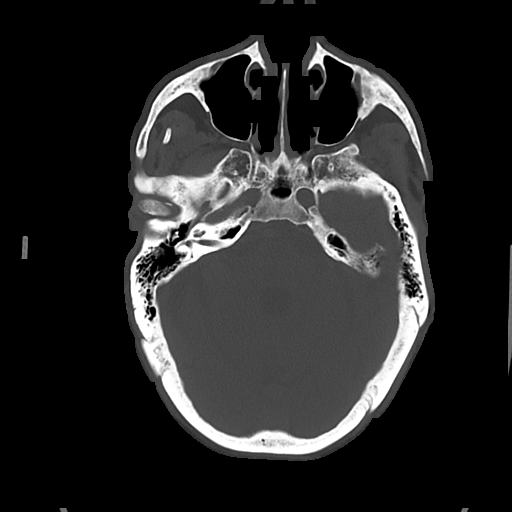

[Series 4: cor soft · coronal · 0.35mm/px · 3 of 77 slices shown]
[im 26/77  brain]
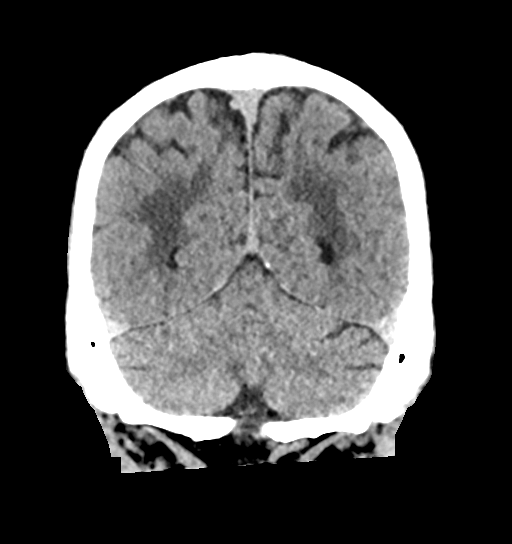
[im 34/77  brain]
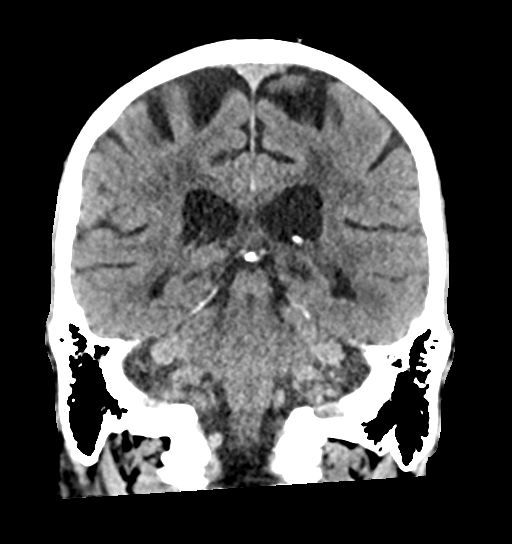
[im 43/77  brain]
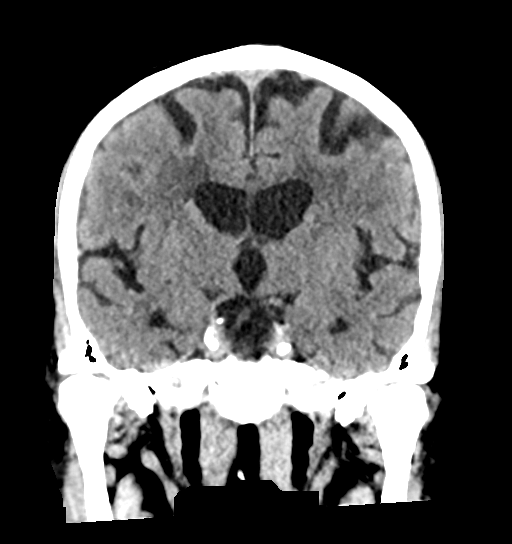

[Series 5: sag soft · sagittal · 0.37mm/px · 3 of 59 slices shown]
[im 20/59  brain]
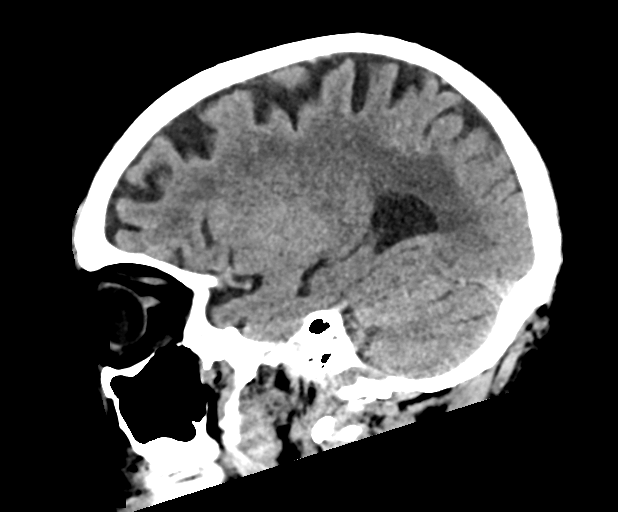
[im 30/59  brain]
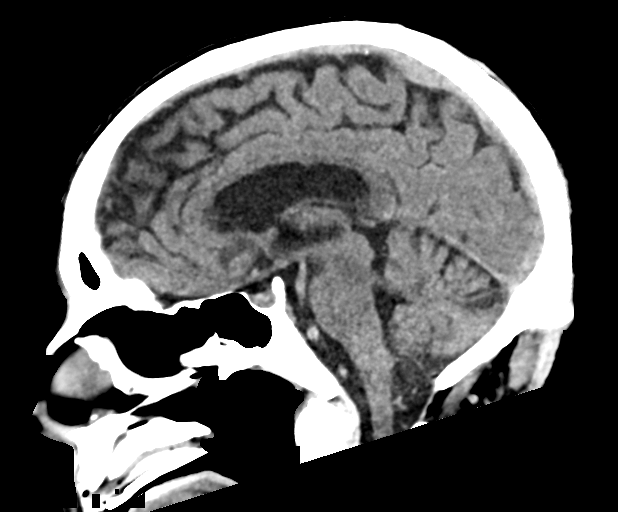
[im 39/59  brain]
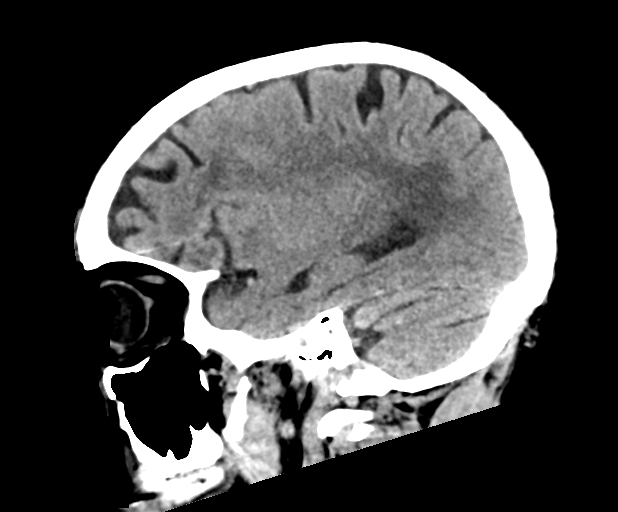

[16 of 47 positions shown; findings below may reference images not displayed]

FINDINGS: Brain: Extensive chronic small-vessel ischemic changes of the
cerebral hemispheric white matter. No CT evidence of acute
infarction, mass lesion, hemorrhage, hydrocephalus or extra-axial
collection.

Vascular: There is atherosclerotic calcification of the major
vessels at the base of the brain.

Skull: Negative

Sinuses/Orbits: Clear/normal

Other: None

ASPECTS (Alberta Stroke Program Early CT Score)

- Ganglionic level infarction (caudate, lentiform nuclei, internal
capsule, insula, M1-M3 cortex): 7

- Supraganglionic infarction (M4-M6 cortex): 3

Total score (0-10 with 10 being normal): 10
IMPRESSION: 1. No acute finding by CT. Chronic small-vessel ischemic changes of
the cerebral hemispheric white matter.
2. ASPECTS is 10
3. These results were communicated to Dr. Timti at [DATE] pmon
06/23/2019by text page via the [employer] messaging system.
